# Patient Record
Sex: Female | Born: 1935 | Race: White | Hispanic: No | Smoking: Former smoker
Health system: Southern US, Community
[De-identification: ages and names within clinical notes are randomized; demographics above are authoritative.]

## PROBLEM LIST (undated history)

## (undated) DIAGNOSIS — C801 Malignant (primary) neoplasm, unspecified: Secondary | ICD-10-CM

## (undated) DIAGNOSIS — I10 Essential (primary) hypertension: Secondary | ICD-10-CM

## (undated) DIAGNOSIS — D649 Anemia, unspecified: Secondary | ICD-10-CM

## (undated) HISTORY — PX: EYE SURGERY: SHX253

## (undated) HISTORY — DX: Malignant (primary) neoplasm, unspecified: C80.1

## (undated) HISTORY — DX: Anemia, unspecified: D64.9

## (undated) HISTORY — PX: BREAST SURGERY: SHX581

---

## 2016-08-01 ENCOUNTER — Ambulatory Visit (INDEPENDENT_AMBULATORY_CARE_PROVIDER_SITE_OTHER): Payer: Medicare Other | Admitting: Family Medicine

## 2016-08-01 ENCOUNTER — Ambulatory Visit (INDEPENDENT_AMBULATORY_CARE_PROVIDER_SITE_OTHER): Payer: Medicare Other

## 2016-08-01 VITALS — BP 160/88 | HR 83 | Temp 98.2°F | Resp 17 | Ht 67.0 in | Wt 137.0 lb

## 2016-08-01 DIAGNOSIS — S0083XA Contusion of other part of head, initial encounter: Secondary | ICD-10-CM | POA: Diagnosis not present

## 2016-08-01 DIAGNOSIS — W19XXXA Unspecified fall, initial encounter: Secondary | ICD-10-CM

## 2016-08-01 DIAGNOSIS — M79641 Pain in right hand: Secondary | ICD-10-CM

## 2016-08-01 DIAGNOSIS — Y92099 Unspecified place in other non-institutional residence as the place of occurrence of the external cause: Secondary | ICD-10-CM | POA: Diagnosis not present

## 2016-08-01 DIAGNOSIS — M25531 Pain in right wrist: Secondary | ICD-10-CM

## 2016-08-01 DIAGNOSIS — Z23 Encounter for immunization: Secondary | ICD-10-CM

## 2016-08-01 DIAGNOSIS — Y92009 Unspecified place in unspecified non-institutional (private) residence as the place of occurrence of the external cause: Secondary | ICD-10-CM

## 2016-08-01 NOTE — Progress Notes (Signed)
Subjective:  By signing my name below, I, Moises Blood, attest that this documentation has been prepared under the direction and in the presence of Merri Ray, MD. Electronically Signed: Moises Blood, [place]. 08/01/2016 , 9:29 AM .  Patient was seen in Room 9 .   Patient ID: Mary Lamb, female    DOB: 17-Jan-1925, 83 y.o.   MRN: [id number] Chief Complaint  Patient presents with  . Fall    6:30 last night/ 2 tylenol twice since fall/ injured left wrist and side of face   HPI Mary Lamb is a 82 y.o. female She is a new patient to [place]. Based on her problem list, she has HTN, hypothyroidism, depression and Alzheimer's dementia. Here after a fall last night.   Patient tripped up yesterday and doesn't recall how it happened. She denies any witnesses. She's been having more frequent falls recently, as this is the 2nd one in less than 4 week. Her PCP is Dr. Marylynn Pearson and has been informed by the patient's daughter about the patient's falls. The patient will be seen by Dr. Dillard Essex later today. An ambulance was called last night but eval was normal. She did not go to the ER.   Patient usually walks about 4 miles a day. When she fell last night, she hit the left side of her face and tried to catch herself with her right wrist, as well skinned knees. She was able to get up and ambulate. She denies LOC, headache, neck pain, back pain, or weakness. She did have ice applied over the right wrist.   She lives at [facility]. She is brought in by her daughter.   There are no active problems to display for this patient.  Past Medical History:  Diagnosis Date  . Anemia   . Cancer [facility])    Past Surgical History:  Procedure Laterality Date  . BREAST SURGERY    . EYE SURGERY     No Known Allergies Prior to Admission medications   Medication Sig Start Date End Date Taking? Authorizing Provider  acetaminophen (TYLENOL) 500 MG tablet Take 500 mg by mouth every 6 (six) hours as needed.   Yes  Historical Provider, MD  buPROPion (WELLBUTRIN XL) 300 MG 24 hr tablet Take 300 mg by mouth daily.   Yes Historical Provider, MD  Cholecalciferol (VITAMIN D3) 1000 units CAPS Take by mouth daily.   Yes Historical Provider, MD  enalapril (VASOTEC) 10 MG tablet Take 10 mg by mouth daily.   Yes Historical Provider, MD  hydrochlorothiazide (MICROZIDE) 12.5 MG capsule Take 12.5 mg by mouth daily.   Yes Historical Provider, MD  levothyroxine (SYNTHROID, LEVOTHROID) 125 MCG tablet Take 125 mcg by mouth daily before breakfast.   Yes Historical Provider, MD  Melatonin 10 MG CAPS Take by mouth at bedtime.   Yes Historical Provider, MD  Memantine HCl-Donepezil HCl (NAMZARIC) 28-10 MG CP24 Take by mouth daily.   Yes Historical Provider, MD  mirtazapine (REMERON) 7.5 MG tablet Take 7.5 mg by mouth at bedtime.   Yes Historical Provider, MD   Social History   Social History  . Marital status: Widowed    Spouse name: N/A  . Number of children: N/A  . Years of education: N/A   Occupational History  . Not on file.   Social History Main Topics  . Smoking status: Former Research scientist (life sciences)  . Smokeless tobacco: Never Used  . Alcohol use No  . Drug use: No  . Sexual activity: Not on file  Other Topics Concern  . Not on file   Social History Narrative  . No narrative on file   Review of Systems  Constitutional: Negative for chills, fatigue, fever and unexpected weight change.  Respiratory: Negative for cough.   Gastrointestinal: Negative for constipation, diarrhea, nausea and vomiting.  Musculoskeletal: Positive for arthralgias, joint swelling and myalgias. Negative for back pain, gait problem and neck pain.  Skin: Positive for wound. Negative for rash.  Neurological: Negative for dizziness, weakness and headaches.       Objective:   Physical Exam  Constitutional: She is oriented to person, place, and time. She appears well-developed and well-nourished. No distress.  HENT:  Head: Normocephalic and  atraumatic.  There is some dried blood over the left upper lip with a small abrasion, [place] border appears unaffected, abrasion with some bruising over the left chin, there is some tenderness over the left maxillary sinus area, able to open and close jaw without difficulty, left maxillary with prominence and soft tissue swelling, nasal bones non tender; no apparent dental injuries, no loose teeth  Eyes: EOM are normal. Pupils are equal, round, and reactive to light.  Neck: Neck supple.  Cardiovascular: Normal rate.   Pulmonary/Chest: Effort normal. No respiratory distress.  Musculoskeletal: Normal range of motion.  4th phalanx held in flexion at the PIP with history of trigger finger, pain free rom right elbow, pain free rom right shoulder, soft tissue over the dorsal wrist, slight discomfort over distal ulnar, some vague discomfort of dorsal wrist but somewhat difficult description, good rom of her fingers and thumbs, pain free rom right thumb; Pain free rom knees bilaterally, pain free rom hips bilaterally  Neurological: She is alert and oriented to person, place, and time.  No pronator drift, equal strength upper and lower extremities bilaterally, no focal weakness, somewhat guarded right hand strength due to pain  Skin: Skin is warm and dry.  Psychiatric: She has a normal mood and affect. Her behavior is normal.  Nursing note and vitals reviewed.   Vitals:   08/01/16 0829  BP: (!) 160/88  Pulse: 83  Resp: 17  Temp: 98.2 F (36.8 C)  TempSrc: Oral  SpO2: 96%  Weight: 137 lb (62.1 kg)  Height: 5\' 7"  (1.702 m)   Dg Hand Complete Right  Result Date: 08/01/2016 CLINICAL DATA:  Dorsal wrist and hand pain.  Fall last night. EXAM: RIGHT HAND - COMPLETE 3+ VIEW COMPARISON:  None. FINDINGS: The bones appear diffusely osteopenic. There is no acute fracture or subluxation identified. Chondrocalcinosis is identified at the distal radial ulnar joint. IMPRESSION: 1. No acute bone abnormality.  2. Osteopenia Electronically Signed   By: Kerby Moors M.D.   On: 08/01/2016 09:51      Assessment & Plan:   Mary Lamb is a 82 y.o. female Right wrist pain - Plan: DG Wrist Complete Right, Splint wrist Right hand pain - Plan: DG Hand Complete Right, Splint wrist  -Possible old avulsion fracture, but no acute injury. velcro wrist splint, follow-up with PCP, consider repeat imaging or ortho eval if still having discomfort at a week and a half to 2 weeks maximum. If tolerated, range of motion once per day out of splint. RTC precautions.  Need for prophylactic vaccination and inoculation against influenza - Plan: Flu Vaccine QUAD 36+ mos IM  Fall at home, initial encounter  - 2 falls in the past 4 weeks. Denies any syncopal events, chest pain, headache, or other concerning symptoms with current fall. Discuss with  PCP today when she will be seen, RTC/ER precautions.  Contusion of face, initial encounter  - Facial abrasion upper face, soap and water cleansing. Discussed CT scanning of face or other imaging of face to rule out fracture, but was declined/deferred at present. RTC precautions, and can discuss with PCP if CT imaging needed.  Elevated BP in office, likely due to pain and possible whitecoat component. Repeat testing with PCP today, nonfocal neuro exam.  Meds ordered this encounter  Medications  . buPROPion (WELLBUTRIN XL) 300 MG 24 hr tablet    Sig: Take 300 mg by mouth daily.  . enalapril (VASOTEC) 10 MG tablet    Sig: Take 10 mg by mouth daily.  . hydrochlorothiazide (MICROZIDE) 12.5 MG capsule    Sig: Take 12.5 mg by mouth daily.  Marland Kitchen levothyroxine (SYNTHROID, LEVOTHROID) 125 MCG tablet    Sig: Take 125 mcg by mouth daily before breakfast.  . Melatonin 10 MG CAPS    Sig: Take by mouth at bedtime.  . mirtazapine (REMERON) 7.5 MG tablet    Sig: Take 7.5 mg by mouth at bedtime.  . Memantine HCl-Donepezil HCl (NAMZARIC) 28-10 MG CP24    Sig: Take by mouth daily.  .  Cholecalciferol (VITAMIN D3) 1000 units CAPS    Sig: Take by mouth daily.  Marland Kitchen acetaminophen (TYLENOL) 500 MG tablet    Sig: Take 500 mg by mouth every 6 (six) hours as needed.   Patient Instructions    Your x-ray did not show any obvious acute fracture. There was a questionable area on the outside of the wrist that may be due to an old injury. In the meantime, you can use the wrist brace for the next week or 2 weeks at the most, with gentle range of motion out of the wrist brace once per day as long as that does not hurt. Follow-up with your primary care provider to determine if other imaging or orthopedic evaluation needed. Tylenol is ok for now.   For the face contusion, you did have some soreness over the cheekbones, so if that swelling or discomfort is not improving in the next day or two, CT scan could determine if there was a fracture. Again, this can be discussed with your primary care provider.  With your recent falls, that can be related to many different causes, sometimes medications. I do not see any signs of stroke on your exam today, but discuss this further with your primary care provider.  Let me know if we can help you in the meantime.Return to the clinic or go to the nearest emergency room if any of your symptoms worsen or new symptoms occur.    IF you received an x-ray today, you will receive an invoice from [facility]. Please contact [facility] at [phone] with questions or concerns regarding your invoice.   IF you received labwork today, you will receive an invoice from [employer]. Please contact [facility] at [phone] with questions or concerns regarding your invoice.   Our billing staff will not be able to assist you with questions regarding bills from these companies.  You will be contacted with the lab results as soon as they are available. The fastest way to get your results is to activate your My Chart account.  Instructions are located on the last page of this paperwork. If you have not heard from [place] regarding the results in 2 weeks, please contact this office.       I personally performed  the services described in this documentation, which was scribed in my presence. The recorded information has been reviewed and considered, and addended by me as needed.   Signed,   Merri Ray, MD [facility].  08/01/16 10:10 AM

## 2016-08-01 NOTE — Patient Instructions (Addendum)
  Your x-ray did not show any obvious acute fracture. There was a questionable area on the outside of the wrist that may be due to an old injury. In the meantime, you can use the wrist brace for the next week or 2 weeks at the most, with gentle range of motion out of the wrist brace once per day as long as that does not hurt. Follow-up with your primary care provider to determine if other imaging or orthopedic evaluation needed. Tylenol is ok for now.   For the face contusion, you did have some soreness over the cheekbones, so if that swelling or discomfort is not improving in the next day or two, CT scan could determine if there was a fracture. Again, this can be discussed with your primary care provider.  With your recent falls, that can be related to many different causes, sometimes medications. I do not see any signs of stroke on your exam today, but discuss this further with your primary care provider.  Let me know if we can help you in the meantime.Return to the clinic or go to the nearest emergency room if any of your symptoms worsen or new symptoms occur.    IF you received an x-ray today, you will receive an invoice from [facility]. Please contact [facility] at [phone] with questions or concerns regarding your invoice.   IF you received labwork today, you will receive an invoice from [employer]. Please contact [facility] at [phone] with questions or concerns regarding your invoice.   Our billing staff will not be able to assist you with questions regarding bills from these companies.  You will be contacted with the lab results as soon as they are available. The fastest way to get your results is to activate your My Chart account. Instructions are located on the last page of this paperwork. If you have not heard from [place] regarding the results in 2 weeks, please contact this office.

## 2016-12-08 ENCOUNTER — Emergency Department (HOSPITAL_COMMUNITY)
Admission: EM | Admit: 2016-12-08 | Discharge: 2016-12-09 | Disposition: A | Discharge status: Skilled Nursing Facility | Payer: Medicare Other | Attending: Emergency Medicine | Admitting: Emergency Medicine

## 2016-12-08 ENCOUNTER — Encounter (HOSPITAL_COMMUNITY): Payer: Self-pay | Admitting: *Deleted

## 2016-12-08 ENCOUNTER — Emergency Department (HOSPITAL_COMMUNITY): Payer: Medicare Other

## 2016-12-08 DIAGNOSIS — Z79899 Other long term (current) drug therapy: Secondary | ICD-10-CM | POA: Diagnosis not present

## 2016-12-08 DIAGNOSIS — Y92129 Unspecified place in nursing home as the place of occurrence of the external cause: Secondary | ICD-10-CM | POA: Diagnosis not present

## 2016-12-08 DIAGNOSIS — I10 Essential (primary) hypertension: Secondary | ICD-10-CM | POA: Diagnosis not present

## 2016-12-08 DIAGNOSIS — F0390 Unspecified dementia without behavioral disturbance: Secondary | ICD-10-CM | POA: Diagnosis not present

## 2016-12-08 DIAGNOSIS — Y999 Unspecified external cause status: Secondary | ICD-10-CM | POA: Diagnosis not present

## 2016-12-08 DIAGNOSIS — Y939 Activity, unspecified: Secondary | ICD-10-CM | POA: Diagnosis not present

## 2016-12-08 DIAGNOSIS — S0083XA Contusion of other part of head, initial encounter: Secondary | ICD-10-CM

## 2016-12-08 DIAGNOSIS — Z853 Personal history of malignant neoplasm of breast: Secondary | ICD-10-CM | POA: Insufficient documentation

## 2016-12-08 DIAGNOSIS — Z87891 Personal history of nicotine dependence: Secondary | ICD-10-CM | POA: Diagnosis not present

## 2016-12-08 DIAGNOSIS — W19XXXA Unspecified fall, initial encounter: Secondary | ICD-10-CM

## 2016-12-08 DIAGNOSIS — S0003XA Contusion of scalp, initial encounter: Secondary | ICD-10-CM

## 2016-12-08 HISTORY — DX: Essential (primary) hypertension: I10

## 2016-12-08 LAB — CBC WITH DIFFERENTIAL/PLATELET
Basophils Absolute: 0 10*3/uL (ref 0.0–0.1)
Basophils Relative: 0 %
Eosinophils Absolute: 0 10*3/uL (ref 0.0–0.7)
Eosinophils Relative: 0 %
HEMATOCRIT: 37.9 % (ref 36.0–46.0)
HEMOGLOBIN: 12.5 g/dL (ref 12.0–15.0)
LYMPHS ABS: 1.2 10*3/uL (ref 0.7–4.0)
LYMPHS PCT: 12 %
MCH: 26.5 pg (ref 26.0–34.0)
MCHC: 33 g/dL (ref 30.0–36.0)
MCV: 80.3 fL (ref 78.0–100.0)
Monocytes Absolute: 1.9 10*3/uL — ABNORMAL HIGH (ref 0.1–1.0)
Monocytes Relative: 18 %
NEUTROS ABS: 7.2 10*3/uL (ref 1.7–7.7)
NEUTROS PCT: 70 %
Platelets: 188 10*3/uL (ref 150–400)
RBC: 4.72 MIL/uL (ref 3.87–5.11)
RDW: 14.2 % (ref 11.5–15.5)
WBC: 10.4 10*3/uL (ref 4.0–10.5)

## 2016-12-08 NOTE — ED Notes (Signed)
Patient in radiology at this time. 

## 2016-12-08 NOTE — ED Notes (Signed)
EKG given to EDP,Long,MD., for review. 

## 2016-12-08 NOTE — ED Notes (Signed)
Bed: WA01 Expected date:  Expected time:  Means of arrival:  Comments: EMS/elderly-found on floor after 24 hours-no complaints at this time

## 2016-12-08 NOTE — ED Notes (Signed)
Pt's daughter reports she left pt last evening around 1800, she states that staff reported when pt woke up around 0600 this am, pt noted to have ecchymosis around her L eye and hematoma on top of her head.  Mechanism of injury is unknown.  Daughter reports she was notified of the incident this am, and reports that pt's injuries was not "this bad."  2 area of ecchymosis noted on L side of her head along with her L eye, redness noted on her nose, R hand swelling and redness also noted.  Pt able to move bila legs without difficulty.  Daughter reports normally pt ambulates with mild assist, tonight when she visited her, pt was leaning to the right and was "off balanced."  Pt was alert upon arrival to the ED, slightly drowsy but responds to voice.  Pt has hx of dementia.

## 2016-12-08 NOTE — ED Triage Notes (Signed)
Per EMS, pt from [facility] unit, pt was found on the floor, questionable as when pt fell.  Pt's family requested pt to be transported to the ED.  EMS states that pt could have been on the floor for more than 24 hours.  No blood thinners.  Hematoma side of her head and bruising noted around her L eye.  Pt is c/o of R rib pain from last fall Valentine's Day.

## 2016-12-08 NOTE — ED Provider Notes (Signed)
[place] DEPT Provider Note   CSN: [id number] Arrival date & time: 12/08/16  2148 By signing my name below, I, Dyke Brackett, attest that this documentation has been prepared under the direction and in the presence of non-physician practitioner, Quincy Carnes, PA-C. Electronically Signed: Dyke Brackett, Scribe. 12/08/2016. 10:47 PM.   History   Chief Complaint Chief Complaint  Patient presents with  . Fall   LEVEL 5 CAVEAT DUE TO DEMENTIA   HPI Mary Lamb is a 83 y.o. female with a history of HTN, cancer and dementia who presents to the Emergency Department for evaluation s/p unwitnessed fall that took place between 6 pm last night and 6 am this morning. The fall was reported by nursing facility staff this morning at 6 AM when patient was found in her bed but with bruising noted to the left side of her face.  Daughter was not notified of this and when she went to see her this evening around 6:30pm And saw the bruising, she requested her to be transferred here for evaluation. Her daughter, she also had a fall on 11/30/2016 while in the shower. States she fell and hit her left ribs against the shower rail. She reports she had x-rays done yesterday and has brought the reports. She had bilateral rib films as well as frontal chest x-ray which was negative for any acute rib fractures. Patient is not currently on anticoagulation per daughter. She is at her baseline mental status currently.  The history is provided by a relative (daughter). No language interpreter was used.   Past Medical History:  Diagnosis Date  . Anemia   . Cancer ([place])   . Hypertension    There are no active problems to display for this patient.  Past Surgical History:  Procedure Laterality Date  . BREAST SURGERY    . EYE SURGERY      OB History    No data available      Home Medications    Prior to Admission medications   Medication Sig Start Date End Date Taking? Authorizing Provider  acetaminophen  (TYLENOL) 500 MG tablet Take 500 mg by mouth every 6 (six) hours as needed.   Yes Historical Provider, MD  buPROPion (WELLBUTRIN XL) 300 MG 24 hr tablet Take 300 mg by mouth daily.   Yes Historical Provider, MD  Cholecalciferol (VITAMIN D3) 1000 units CAPS Take 1,000 Units by mouth daily.    Yes Historical Provider, MD  enalapril (VASOTEC) 10 MG tablet Take 10 mg by mouth daily.   Yes Historical Provider, MD  ibuprofen (ADVIL,MOTRIN) 200 MG tablet Take 400 mg by mouth every 6 (six) hours as needed.   Yes Historical Provider, MD  levothyroxine (SYNTHROID, LEVOTHROID) 125 MCG tablet Take 125 mcg by mouth daily before breakfast.   Yes Historical Provider, MD  Melatonin 10 MG CAPS Take by mouth at bedtime.   Yes Historical Provider, MD  Memantine HCl-Donepezil HCl (NAMZARIC) 28-10 MG CP24 Take 1 capsule by mouth daily.    Yes Historical Provider, MD    Family History Family History  Problem Relation Age of Onset  . Cancer Mother   . Cancer Sister   . Cancer Brother   . Cancer Brother   . Stroke Sister     Social History Social History  Substance Use Topics  . Smoking status: Former Research scientist (life sciences)  . Smokeless tobacco: Never Used  . Alcohol use No    Allergies   Patient has no known allergies.   Review of Systems  Review of Systems  Unable to perform ROS: Dementia   Physical Exam Updated Vital Signs BP 174/93   Pulse 94   Temp 98.2 F (36.8 C) (Oral)   Resp 13   SpO2 94%   Physical Exam  Constitutional: She appears well-developed and well-nourished.  HENT:  Head: Normocephalic and atraumatic.  Mouth/Throat: Oropharynx is clear and moist.  Large amount of bruising of the left forehead and parietal scalp without noted skull depression or deformity  Eyes: Conjunctivae and EOM are normal. Pupils are equal, round, and reactive to light.  Bruising surrounding left eye to include the upper lid, there is no deformity of the orbital rim, pupils are symmetric and reactive bilaterally    Neck: Normal range of motion.  Cardiovascular: Normal rate, regular rhythm and normal heart sounds.   Pulmonary/Chest: Effort normal and breath sounds normal. She has no wheezes. She has no rhonchi.  No bruising or deformities of the chest wall, ribs are nontender to palpation, lungs clear  Abdominal: Soft. Bowel sounds are normal.  Musculoskeletal: Normal range of motion.  Pelvis is nontender, no leg shortening, DP pulses intact bilaterally  Neurological:  Sleeping but will awake to verbal and tactile stimuli, she answers questions and follows commands when prompted  Skin: Skin is warm and dry.  Psychiatric: She has a normal mood and affect.  Nursing note and vitals reviewed.  ED Treatments / Results  DIAGNOSTIC STUDIES:  Oxygen Saturation is 94% on RA, low by my interpretation.    COORDINATION OF CARE:  10:49 PM Discussed treatment plan with pt at bedside and pt agreed to plan.   Labs (all labs ordered are listed, but only abnormal results are displayed) Labs Reviewed  CBC WITH DIFFERENTIAL/PLATELET - Abnormal; Notable for the following:       Result Value   Monocytes Absolute 1.9 (*)    All other components within normal limits  COMPREHENSIVE METABOLIC PANEL - Abnormal; Notable for the following:    Potassium 3.3 (*)    Glucose, Bld 117 (*)    BUN 24 (*)    Total Protein 6.4 (*)    All other components within normal limits  URINALYSIS, ROUTINE W REFLEX MICROSCOPIC - Abnormal; Notable for the following:    APPearance CLOUDY (*)    Protein, ur 30 (*)    Squamous Epithelial / LPF 0-5 (*)    All other components within normal limits  URINE CULTURE  CK  I-STAT TROPOININ, ED    EKG  EKG Interpretation  Date/Time:  Thursday December 08 2016 23:10:31 EST Ventricular Rate:  105 PR Interval:    QRS Duration: 82 QT Interval:  348 QTC Calculation: 460 R Axis:   69 Text Interpretation:  Sinus tachycardia Probable left atrial enlargement Probable left ventricular  hypertrophy No STEMI.  Confirmed by LONG MD, JOSHUA [phone]) on 12/08/2016 11:12:56 PM       Radiology Dg Chest 1 View  Result Date: 12/08/2016 CLINICAL DATA:  Status post fall, with generalized chest pain and shortness of breath. Initial encounter. EXAM: CHEST 1 VIEW COMPARISON:  None. FINDINGS: The lungs are well-aerated. A small left pleural effusion is seen. Haziness at the left midlung may reflect overlying soft tissues or possibly mild infection. There is no evidence of pneumothorax. The cardiomediastinal silhouette is mildly enlarged. No acute osseous abnormalities are seen. IMPRESSION: 1. Small left pleural effusion. Mild cardiomegaly. Haziness at the left midlung may reflect overlying soft tissues or possibly mild infection. 2. No displaced rib fracture seen.  Electronically Signed   By: Garald Balding M.D.   On: 12/08/2016 23:51   Dg Pelvis 1-2 Views  Result Date: 12/08/2016 CLINICAL DATA:  Status post fall 24 hours ago, with generalized pelvic pain. Initial encounter. EXAM: PELVIS - 1-2 VIEW COMPARISON:  None. FINDINGS: There is no evidence of fracture or dislocation. Both femoral heads are seated normally within their respective acetabula. No significant degenerative change is appreciated. The sacroiliac joints are unremarkable in appearance. The visualized bowel gas pattern is grossly unremarkable in appearance. Scattered phleboliths are noted within the pelvis. IMPRESSION: No evidence of fracture or dislocation. Electronically Signed   By: Garald Balding M.D.   On: 12/08/2016 23:52   Ct Head Wo Contrast  Result Date: 12/09/2016 CLINICAL DATA:  Fall with facial bruising EXAM: CT HEAD WITHOUT CONTRAST CT MAXILLOFACIAL WITHOUT CONTRAST CT CERVICAL SPINE WITHOUT CONTRAST TECHNIQUE: Multidetector CT imaging of the head, cervical spine, and maxillofacial structures were performed using the standard protocol without intravenous contrast. Multiplanar CT image reconstructions of the cervical spine  and maxillofacial structures were also generated. COMPARISON:  None. FINDINGS: CT HEAD FINDINGS Brain: No mass lesion, intraparenchymal hemorrhage or extra-axial collection. No evidence of acute cortical infarct. There is periventricular hypoattenuation compatible with chronic microvascular disease. There is diffuse volume loss without lobar predilection. Vascular: No hyperdense vessel. CT MAXILLOFACIAL FINDINGS Osseous: --Complex facial fracture types: No LeFort, zygomaticomaxillary complex or nasoorbitoethmoidal fracture. --Simple fracture types: None. --Mandible: No fracture. There is mild anterior subluxation of both temporomandibular joints, which may be positional. Orbits: The globes appear intact. Normal appearance of the intra- and extraconal fat. Symmetric extraocular muscles. Sinuses: No fluid levels or advanced mucosal thickening. Soft tissues: There is soft tissue swelling overlying the left zygomatic arch and left frontoparietal scalp. CT CERVICAL SPINE FINDINGS Alignment: No static subluxation. Facets are aligned. Occipital condyles are normally positioned. Skull base and vertebrae: No acute fracture. Soft tissues and spinal canal: No prevertebral fluid or swelling. No visible canal hematoma. Disc levels: There is multilevel facet hypertrophy, left-greater-than-right. No bony spinal canal stenosis. Upper chest: No pneumothorax, pulmonary nodule or pleural effusion. Other: Normal visualized paraspinal cervical soft tissues. IMPRESSION: 1. Chronic microvascular ischemia and atrophy without acute intracranial abnormality. 2. No acute fracture or static subluxation of the cervical spine. 3. Left scalp and facial soft tissue swelling without associated fracture. Electronically Signed   By: Ulyses Jarred M.D.   On: 12/09/2016 00:02   Ct Angio Chest Pe W And/or Wo Contrast  Result Date: 12/09/2016 CLINICAL DATA:  Chest pain.  History of breast cancer. EXAM: CT ANGIOGRAPHY CHEST WITH CONTRAST TECHNIQUE:  Multidetector CT imaging of the chest was performed using the standard protocol during bolus administration of intravenous contrast. Multiplanar CT image reconstructions and MIPs were obtained to evaluate the vascular anatomy. CONTRAST:  100 mL Isovue 370 IV COMPARISON:  Same day chest radiograph FINDINGS: Cardiovascular: Contrast injection is sufficient to demonstrate satisfactory opacification of the pulmonary arteries to the segmental level. There is no pulmonary embolus. The main pulmonary artery is within normal limits for size. There is no CT evidence of acute right heart strain. There is atherosclerotic calcification within the thoracic aorta. No dissection, aneurysm or ulceration. There is a normal variant aortic arch branching pattern with the brachiocephalic and left common carotid arteries sharing a common origin. Heart size is normal, without pericardial effusion. Mediastinum/Nodes: No mediastinal, hilar or axillary lymphadenopathy. The visualized thyroid and thoracic esophageal course are unremarkable. Lungs/Pleura: There is biapical scarring. Bibasilar atelectasis. No pneumothorax.  There is a small left pleural effusion. No pulmonary nodules. Upper Abdomen: Contrast bolus timing is not optimized for evaluation of the abdominal organs. Within this limitation, the visualized organs of the upper abdomen are normal. Musculoskeletal: There is an age indeterminate compression deformity of the T4 vertebral body. There is a hemangioma within the T5 vertebral body. Review of the MIP images confirms the above findings. IMPRESSION: 1. No pulmonary embolus or acute aortic syndrome. 2. Age indeterminate compression fracture of the T4 vertebral body. 3. Small left pleural effusion. 4. Aortic atherosclerosis. Electronically Signed   By: Ulyses Jarred M.D.   On: 12/09/2016 02:44   Ct Cervical Spine Wo Contrast  Result Date: 12/09/2016 CLINICAL DATA:  Fall with facial bruising EXAM: CT HEAD WITHOUT CONTRAST CT  MAXILLOFACIAL WITHOUT CONTRAST CT CERVICAL SPINE WITHOUT CONTRAST TECHNIQUE: Multidetector CT imaging of the head, cervical spine, and maxillofacial structures were performed using the standard protocol without intravenous contrast. Multiplanar CT image reconstructions of the cervical spine and maxillofacial structures were also generated. COMPARISON:  None. FINDINGS: CT HEAD FINDINGS Brain: No mass lesion, intraparenchymal hemorrhage or extra-axial collection. No evidence of acute cortical infarct. There is periventricular hypoattenuation compatible with chronic microvascular disease. There is diffuse volume loss without lobar predilection. Vascular: No hyperdense vessel. CT MAXILLOFACIAL FINDINGS Osseous: --Complex facial fracture types: No LeFort, zygomaticomaxillary complex or nasoorbitoethmoidal fracture. --Simple fracture types: None. --Mandible: No fracture. There is mild anterior subluxation of both temporomandibular joints, which may be positional. Orbits: The globes appear intact. Normal appearance of the intra- and extraconal fat. Symmetric extraocular muscles. Sinuses: No fluid levels or advanced mucosal thickening. Soft tissues: There is soft tissue swelling overlying the left zygomatic arch and left frontoparietal scalp. CT CERVICAL SPINE FINDINGS Alignment: No static subluxation. Facets are aligned. Occipital condyles are normally positioned. Skull base and vertebrae: No acute fracture. Soft tissues and spinal canal: No prevertebral fluid or swelling. No visible canal hematoma. Disc levels: There is multilevel facet hypertrophy, left-greater-than-right. No bony spinal canal stenosis. Upper chest: No pneumothorax, pulmonary nodule or pleural effusion. Other: Normal visualized paraspinal cervical soft tissues. IMPRESSION: 1. Chronic microvascular ischemia and atrophy without acute intracranial abnormality. 2. No acute fracture or static subluxation of the cervical spine. 3. Left scalp and facial soft  tissue swelling without associated fracture. Electronically Signed   By: Ulyses Jarred M.D.   On: 12/09/2016 00:02   Dg Hand Complete Right  Result Date: 12/08/2016 CLINICAL DATA:  Right hand swelling with redness EXAM: RIGHT HAND - COMPLETE 3+ VIEW COMPARISON:  08/01/2016 FINDINGS: Osteopenia is present. There is no fracture. There is flexion deformity at the fourth PIP joint. No osseous erosions. No significant joint space narrowing. Calcifications at the triangular fibrocartilage. IMPRESSION: 1. Osteopenia without acute osseous abnormality 2. Chondrocalcinosis at the wrist Electronically Signed   By: Donavan Foil M.D.   On: 12/08/2016 22:42   Ct Maxillofacial Wo Contrast  Result Date: 12/09/2016 CLINICAL DATA:  Fall with facial bruising EXAM: CT HEAD WITHOUT CONTRAST CT MAXILLOFACIAL WITHOUT CONTRAST CT CERVICAL SPINE WITHOUT CONTRAST TECHNIQUE: Multidetector CT imaging of the head, cervical spine, and maxillofacial structures were performed using the standard protocol without intravenous contrast. Multiplanar CT image reconstructions of the cervical spine and maxillofacial structures were also generated. COMPARISON:  None. FINDINGS: CT HEAD FINDINGS Brain: No mass lesion, intraparenchymal hemorrhage or extra-axial collection. No evidence of acute cortical infarct. There is periventricular hypoattenuation compatible with chronic microvascular disease. There is diffuse volume loss without lobar predilection. Vascular: No hyperdense  vessel. CT MAXILLOFACIAL FINDINGS Osseous: --Complex facial fracture types: No LeFort, zygomaticomaxillary complex or nasoorbitoethmoidal fracture. --Simple fracture types: None. --Mandible: No fracture. There is mild anterior subluxation of both temporomandibular joints, which may be positional. Orbits: The globes appear intact. Normal appearance of the intra- and extraconal fat. Symmetric extraocular muscles. Sinuses: No fluid levels or advanced mucosal thickening. Soft  tissues: There is soft tissue swelling overlying the left zygomatic arch and left frontoparietal scalp. CT CERVICAL SPINE FINDINGS Alignment: No static subluxation. Facets are aligned. Occipital condyles are normally positioned. Skull base and vertebrae: No acute fracture. Soft tissues and spinal canal: No prevertebral fluid or swelling. No visible canal hematoma. Disc levels: There is multilevel facet hypertrophy, left-greater-than-right. No bony spinal canal stenosis. Upper chest: No pneumothorax, pulmonary nodule or pleural effusion. Other: Normal visualized paraspinal cervical soft tissues. IMPRESSION: 1. Chronic microvascular ischemia and atrophy without acute intracranial abnormality. 2. No acute fracture or static subluxation of the cervical spine. 3. Left scalp and facial soft tissue swelling without associated fracture. Electronically Signed   By: Ulyses Jarred M.D.   On: 12/09/2016 00:02    Procedures Procedures (including critical care time)  Medications Ordered in ED Medications - No data to display   Initial Impression / Assessment and Plan / ED Course  I have reviewed the triage vital signs and the nursing notes.  Pertinent labs & imaging results that were available during my care of the patient were reviewed by me and considered in my medical decision making (see chart for details).  83 year old female here with a fall.  This occurred sometime between 6 PM yesterday and 6 AM this morning. Daughter went to visit her this afternoon and saw a large amount of bruising on her head and wanted her transferred here for evaluation. Patient is sleeping, but awakens to verbal and tactile stimuli. She is answering questions and following commands when prompted. Daughter reports this is her baseline mental status. She does have a large amount of bruising of the left side of her head and forehead to include around the left eye. There is no bony deformities. Ribs are nontender. Pelvis is stable, no  leg shortening. Given she has had 2 falls within the past 10 days, will obtain basic labs as well as imaging studies to ensure no underlying injuries.  Imaging studies without signs of acute traumatic injuries. CT of the chest with age determinate compression fracture of T4.  Patient has not had any recent back pain. She continues moving her arms and legs normally without any apparent deficits.  CXR with questionable pneumonia.  No hypoxia here.  Given patient is in facility will cover with abx. Labwork is overall reassuring. UA without signs of infection. EKG is nonischemic. CK is normal. Given rather negative evaluation here, feel she is stable for discharge home.  Will start on levaquin (QT WNL today) given CXR findings.  Will have her follow-up with PCP.  Discussed plan with daughter, she acknowledged understanding and agreed with plan of care.  Return precautions given for any new/worsening symptoms.   Case discussed with attending physician, Dr. Regenia Skeeter, who evaluated patient and agrees with assessment and plan of care.  Final Clinical Impressions(s) / ED Diagnoses   Final diagnoses:  Fall, initial encounter  Contusion of scalp, initial encounter  Contusion of face, initial encounter    New Prescriptions Discharge Medication List as of 12/09/2016  3:39 AM    START taking these medications   Details  levofloxacin (LEVAQUIN) 750 MG  tablet Take 1 tablet (750 mg total) by mouth daily., Starting Fri 12/09/2016, Print       I personally performed the services described in this documentation, which was scribed in my presence. The recorded information has been reviewed and is accurate.   Larene Pickett, PA-C 12/09/16 [place], PA-C 12/09/16 [place], MD 12/12/16 [phone]

## 2016-12-09 ENCOUNTER — Emergency Department (HOSPITAL_COMMUNITY): Payer: Medicare Other

## 2016-12-09 ENCOUNTER — Encounter (HOSPITAL_COMMUNITY): Payer: Self-pay

## 2016-12-09 LAB — URINALYSIS, ROUTINE W REFLEX MICROSCOPIC
BILIRUBIN URINE: NEGATIVE
Bacteria, UA: NONE SEEN
GLUCOSE, UA: NEGATIVE mg/dL
HGB URINE DIPSTICK: NEGATIVE
Ketones, ur: NEGATIVE mg/dL
LEUKOCYTES UA: NEGATIVE
NITRITE: NEGATIVE
PROTEIN: 30 mg/dL — AB
Specific Gravity, Urine: 1.015 (ref 1.005–1.030)
pH: 7 (ref 5.0–8.0)

## 2016-12-09 LAB — COMPREHENSIVE METABOLIC PANEL
ALBUMIN: 3.8 g/dL (ref 3.5–5.0)
ALT: 20 U/L (ref 14–54)
AST: 29 U/L (ref 15–41)
Alkaline Phosphatase: 80 U/L (ref 38–126)
Anion gap: 8 (ref 5–15)
BUN: 24 mg/dL — AB (ref 6–20)
CHLORIDE: 103 mmol/L (ref 101–111)
CO2: 26 mmol/L (ref 22–32)
Calcium: 9.1 mg/dL (ref 8.9–10.3)
Creatinine, Ser: 0.54 mg/dL (ref 0.44–1.00)
GFR calc Af Amer: >60 mL/min (ref >60)
GFR calc non Af Amer: >60 mL/min (ref >60)
GLUCOSE: 117 mg/dL — AB (ref 65–99)
POTASSIUM: 3.3 mmol/L — AB (ref 3.5–5.1)
SODIUM: 137 mmol/L (ref 135–145)
Total Bilirubin: 1.1 mg/dL (ref 0.3–1.2)
Total Protein: 6.4 g/dL — ABNORMAL LOW (ref 6.5–8.1)

## 2016-12-09 LAB — I-STAT TROPONIN, ED: Troponin i, poc: 0 ng/mL (ref 0.00–0.08)

## 2016-12-09 LAB — CK: Total CK: 210 U/L (ref 38–234)

## 2016-12-09 MED ORDER — LEVOFLOXACIN 750 MG PO TABS: 750.0000 mg | ORAL_TABLET | Freq: Every day | ORAL | 0 refills | Status: DC

## 2016-12-09 MED ORDER — IOPAMIDOL (ISOVUE-370) INJECTION 76%
100.0000 mL | Freq: Once | INTRAVENOUS | Status: AC | PRN
  Administered 2016-12-09: 100 mL via INTRAVENOUS

## 2016-12-09 MED ORDER — IOPAMIDOL (ISOVUE-370) INJECTION 76%
INTRAVENOUS | Status: AC
  Administered 2016-12-09: 100 mL via INTRAVENOUS
  Filled 2016-12-09: qty 100

## 2016-12-09 MED ORDER — SODIUM CHLORIDE 0.9 % IJ SOLN
INTRAMUSCULAR | Status: AC
  Filled 2016-12-09: qty 50

## 2016-12-09 NOTE — ED Notes (Signed)
Patient transported to CT 

## 2016-12-09 NOTE — ED Notes (Signed)
Patient returned from CT

## 2016-12-09 NOTE — ED Notes (Signed)
[facility] on site for patient transportation to [facility].

## 2016-12-09 NOTE — Discharge Instructions (Signed)
Imaging did not show any underlying injuries.  There was some concern for pneumonia on chest x-ray. Take the prescribed medication as directed. Follow-up with your primary care doctor. Return to the ED for new or worsening symptoms.

## 2016-12-09 NOTE — ED Notes (Signed)
Pt. Walked to bathroom with 2 assist.

## 2016-12-10 LAB — URINE CULTURE

## 2017-01-28 ENCOUNTER — Ambulatory Visit (INDEPENDENT_AMBULATORY_CARE_PROVIDER_SITE_OTHER): Payer: Medicare Other | Admitting: Physician Assistant

## 2017-01-28 VITALS — BP 175/91 | HR 72 | Temp 98.1°F | Ht 67.0 in | Wt 131.2 lb

## 2017-01-28 DIAGNOSIS — R4182 Altered mental status, unspecified: Secondary | ICD-10-CM

## 2017-01-28 LAB — POCT URINALYSIS DIP (MANUAL ENTRY)
BILIRUBIN UA: NEGATIVE
Blood, UA: NEGATIVE
Glucose, UA: NEGATIVE mg/dL
Ketones, POC UA: NEGATIVE mg/dL
LEUKOCYTES UA: NEGATIVE
NITRITE UA: NEGATIVE
Spec Grav, UA: 1.02 (ref 1.010–1.025)
Urobilinogen, UA: 4 E.U./dL — AB
pH, UA: 7 (ref 5.0–8.0)

## 2017-01-28 LAB — POC MICROSCOPIC URINALYSIS (UMFC): MUCUS RE: ABSENT

## 2017-01-28 NOTE — Progress Notes (Signed)
Mary Lamb  MRN: [id number] DOB: 03/21/1925  PCP: Marylynn Pearson, MD  Chief Complaint  Patient presents with  . Altered Mental Status  . Urinary Accidents    Subjective:  Pt presents to clinic for altered mental status for the last 2 weeks and then 2 urinary accidents over the last 2 days - history is presented by her daughter who brought her from the assisted living residence.  She has dementia but it seems to be progressing recently - esp over the last 2 weeks - she is also not eating as much as she used to.  Metoprolol was added to her medications about 2 weeks ago and daughter wonders whether that could be a possible cause.  She has not run fevers.  She has been more lethargic over the last 2 weeks and not as active around the yard like she once was.  She has no agitation or aggression.  Daughter mainly wants to r/o UTI as she has had these in the past.  She has had no cold symptoms recently.  No F/C/cough.  She is working with her PCP with her BP and it is being checked daily at her facility.  The addition of metoprolol was for her BP.  Review of Systems  Unable to perform ROS: Dementia    There are no active problems to display for this patient.   Current Outpatient Prescriptions on File Prior to Visit  Medication Sig Dispense Refill  . acetaminophen (TYLENOL) 500 MG tablet Take 500 mg by mouth every 6 (six) hours as needed.    Marland Kitchen buPROPion (WELLBUTRIN XL) 300 MG 24 hr tablet Take 300 mg by mouth daily.    . Cholecalciferol (VITAMIN D3) 1000 units CAPS Take 1,000 Units by mouth daily.     . enalapril (VASOTEC) 10 MG tablet Take 10 mg by mouth daily.    Marland Kitchen ibuprofen (ADVIL,MOTRIN) 200 MG tablet Take 400 mg by mouth every 6 (six) hours as needed.    Marland Kitchen levothyroxine (SYNTHROID, LEVOTHROID) 125 MCG tablet Take 125 mcg by mouth daily before breakfast.    . Melatonin 10 MG CAPS Take by mouth at bedtime.    . Memantine HCl-Donepezil HCl (NAMZARIC) 28-10 MG CP24 Take 1 capsule by mouth  daily.      No current facility-administered medications on file prior to visit.     No Known Allergies  Pt patients past, family and social history were reviewed and updated.   Objective:  BP (!) 175/91   Pulse 72   Temp 98.1 F (36.7 C) (Oral)   Ht 5\' 7"  (1.702 m)   Wt 131 lb 3 oz (59.5 kg)   BMI 20.55 kg/m   Physical Exam  Constitutional: She is oriented to person, place, and time and well-developed, well-nourished, and in no distress.  HENT:  Head: Normocephalic and atraumatic.  Right Ear: Hearing and external ear normal.  Left Ear: Hearing and external ear normal.  Eyes: Conjunctivae are normal.  Neck: Normal range of motion.  Cardiovascular: Normal rate, regular rhythm and normal heart sounds.   No murmur heard. Pulmonary/Chest: Effort normal and breath sounds normal. She has no wheezes.  Abdominal: Soft. Bowel sounds are normal. There is no tenderness.  Neurological: She is alert and oriented to person, place, and time. Gait normal.  Skin: Skin is warm and dry.  Psychiatric: Mood, memory, affect and judgment normal.  Vitals reviewed.  Results for orders placed or performed in visit on 01/28/17  POCT urinalysis dipstick  Result Value Ref Range   Color, UA yellow yellow   Clarity, UA clear clear   Glucose, UA negative negative mg/dL   Bilirubin, UA negative negative   Ketones, POC UA negative negative mg/dL   Spec Grav, UA 1.020 1.010 - 1.025   Blood, UA negative negative   pH, UA 7.0 5.0 - 8.0   Protein Ur, POC trace (A) negative mg/dL   Urobilinogen, UA 4.0 (A) 0.2 or 1.0 E.U./dL   Nitrite, UA Negative Negative   Leukocytes, UA Negative Negative  POCT Microscopic Urinalysis ([facility])  Result Value Ref Range   WBC,UR,HPF,POC None None WBC/hpf   RBC,UR,HPF,POC None None RBC/hpf   Bacteria None None, Too numerous to count   Mucus Absent Absent   Epithelial Cells, UR Per Microscopy Few (A) None, Too numerous to count cells/hpf     Assessment and Plan :    Altered mental status, unspecified altered mental status type - Plan: POCT urinalysis dipstick, POCT Microscopic Urinalysis ([facility]), Urine culture No sign today of UTI - will send for culture.  Continue to monitor BP - likely new BP medication can not completely started to work yet.  F/u will be with PCP.  Dr Dillard Essex - fax [phone]  Windell Hummingbird PA-C  [facility] 01/28/2017 3:03 PM

## 2017-01-28 NOTE — Patient Instructions (Signed)
     IF you received an x-ray today, you will receive an invoice from [facility]. Please contact [facility] at [phone] with questions or concerns regarding your invoice.   IF you received labwork today, you will receive an invoice from [facility]. Please contact [facility] at [phone] with questions or concerns regarding your invoice.   Our billing staff will not be able to assist you with questions regarding bills from these companies.  You will be contacted with the lab results as soon as they are available. The fastest way to get your results is to activate your My Chart account. Instructions are located on the last page of this paperwork. If you have not heard from us regarding the results in 2 weeks, please contact this office.     

## 2017-01-29 LAB — URINE CULTURE: Organism ID, Bacteria: NO GROWTH

## 2017-02-07 ENCOUNTER — Encounter: Payer: Self-pay | Admitting: Radiology

## 2017-02-10 ENCOUNTER — Encounter: Payer: Self-pay | Admitting: Cardiovascular Disease

## 2017-02-10 ENCOUNTER — Ambulatory Visit (INDEPENDENT_AMBULATORY_CARE_PROVIDER_SITE_OTHER): Payer: Medicare Other | Admitting: Cardiovascular Disease

## 2017-02-10 VITALS — BP 108/64 | HR 103 | Ht 66.0 in | Wt 125.6 lb

## 2017-02-10 DIAGNOSIS — Z853 Personal history of malignant neoplasm of breast: Secondary | ICD-10-CM

## 2017-02-10 DIAGNOSIS — R634 Abnormal weight loss: Secondary | ICD-10-CM | POA: Diagnosis not present

## 2017-02-10 DIAGNOSIS — I10 Essential (primary) hypertension: Secondary | ICD-10-CM

## 2017-02-10 DIAGNOSIS — R0602 Shortness of breath: Secondary | ICD-10-CM

## 2017-02-10 DIAGNOSIS — E039 Hypothyroidism, unspecified: Secondary | ICD-10-CM

## 2017-02-10 DIAGNOSIS — I951 Orthostatic hypotension: Secondary | ICD-10-CM

## 2017-02-10 DIAGNOSIS — F0390 Unspecified dementia without behavioral disturbance: Secondary | ICD-10-CM

## 2017-02-10 DIAGNOSIS — G4710 Hypersomnia, unspecified: Secondary | ICD-10-CM

## 2017-02-10 DIAGNOSIS — Z79899 Other long term (current) drug therapy: Secondary | ICD-10-CM

## 2017-02-10 LAB — CBC WITH DIFFERENTIAL/PLATELET
Basophils Absolute: 0 {cells}/uL (ref 0–200)
Basophils Relative: 0 %
Eosinophils Absolute: 0 {cells}/uL — ABNORMAL LOW (ref 15–500)
Eosinophils Relative: 0 %
HCT: 45.8 % — ABNORMAL HIGH (ref 35.0–45.0)
Hemoglobin: 14.5 g/dL (ref 11.7–15.5)
Lymphocytes Relative: 9 %
Lymphs Abs: 1044 {cells}/uL (ref 850–3900)
MCH: 26.2 pg — ABNORMAL LOW (ref 27.0–33.0)
MCHC: 31.7 g/dL — ABNORMAL LOW (ref 32.0–36.0)
MCV: 82.8 fL (ref 80.0–100.0)
MPV: 9.7 fL (ref 7.5–12.5)
Monocytes Absolute: 1624 {cells}/uL — ABNORMAL HIGH (ref 200–950)
Monocytes Relative: 14 %
Neutro Abs: 8932 {cells}/uL — ABNORMAL HIGH (ref 1500–7800)
Neutrophils Relative %: 77 %
Platelets: 305 K/uL (ref 140–400)
RBC: 5.53 MIL/uL — ABNORMAL HIGH (ref 3.80–5.10)
RDW: 14.2 % (ref 11.0–15.0)
WBC: 11.6 K/uL — ABNORMAL HIGH (ref 3.8–10.8)

## 2017-02-10 LAB — COMPREHENSIVE METABOLIC PANEL
ALBUMIN: 3.5 g/dL — AB (ref 3.6–5.1)
ALT: 18 U/L (ref 6–29)
AST: 24 U/L (ref 10–35)
Alkaline Phosphatase: 105 U/L (ref 33–130)
BUN: 25 mg/dL (ref 7–25)
CALCIUM: 9.4 mg/dL (ref 8.6–10.4)
CHLORIDE: 103 mmol/L (ref 98–110)
CO2: 24 mmol/L (ref 20–31)
Creat: 0.95 mg/dL — ABNORMAL HIGH (ref 0.60–0.88)
Glucose, Bld: 95 mg/dL (ref 65–99)
POTASSIUM: 3.9 mmol/L (ref 3.5–5.3)
Sodium: 142 mmol/L (ref 135–146)
Total Bilirubin: 1 mg/dL (ref 0.2–1.2)
Total Protein: 6.3 g/dL (ref 6.1–8.1)

## 2017-02-10 NOTE — Patient Instructions (Signed)
Medication Instructions:   1.) STOP ENALAPRIL AND METOPROLOL  Labwork:  CMET, CBC-DIFF TSH T3 T4 TODAY  Testing/Procedures:  Your physician has requested that you have an echocardiogram. Echocardiography is a painless test that uses sound waves to create images of your heart. It provides your doctor with information about the size and shape of your heart and how well your heart's chambers and valves are working. This procedure takes approximately one hour. There are no restrictions for this procedure.  This will be done at our  [place] unless otherwise requested.  Follow-Up:  3-4 weeks with Dr Claiborne Billings  Any Other Special Instructions Will Be Listed Below (If Applicable).

## 2017-02-10 NOTE — Progress Notes (Signed)
Cardiology Office Note    Date:  02/11/2017   ID:  Mary Lamb, DOB 03/01/1925, MRN [id number]  PCP:  Mary Pearson, MD  Cardiologist:  Mary Majestic, MD   Chief Complaint  Patient presents with  . Shortness of Breath    pt states some SOB  . Edema    states some in both ankles    New Cardiology Evaluation  History of Present Illness:  Mary Lamb is a 83 y.o. female  who is brought here by her daughter and is a resident of [facility] memory unit.  She is followed by Dr. Marylynn Lamb.  Mary Lamb has a history of hypertension, hypothyroidism, and has developed progressive dementia and recent weight loss.  According to her daughter, she has a history of hypertension for at least 10 years.  She has had significant decline in memory issues over the past 4 years esulting in her  move from the [place] and is been at [facility] for several years.  She has been on enalapril for hypertension for many years and has also been taking levothyroxine 125 g.  Over the last several months, her blood pressure had been increased and as result, she was started initially on metoprolol succinate 25 mg and ultimately this had been titrated up to 25 mg twice a day.  She has had significant recent weight loss, as well as poor appetite and has lost 16 pounds over the past 1-1/2 months.  Recently, her metoprolol has been reduced and tapered and since she has not been eating.  She had been changed to tartrate.  The patient cannot give a history.  During the office evaluation, the patient's daughter called Dr. Marylynn Lamb, so I was able to speak with her during this encounter and obtain more clinical history.  In January 2018, the patient's BUN was 26 and creatinine 0.86.  Her blood pressure had risen this year up to 174 systolic.  She was recently evaluated for possible UTI and had negative urinalysis and culture.  She was evaluated at urgent care and [facility] on 01/28/2017 and during that evaluation, she was  hypertensive  with a blood pressure of 175/91.  The patient has never had a cardiac evaluation.  She also has noticed some chest wall discomfort.  She has a history of breast surgery and is status post mastectomy.  Recently, she has been sleeping often and has been very fatigued.  An Epworth Sleepiness Scale score was calculated in the office today and this endorsed at 24, which is maximal excessive daytime sleepiness.  She presents for evaluation.   Past Medical History:  Diagnosis Date  . Anemia   . Cancer ([place])   . Hypertension     Past Surgical History:  Procedure Laterality Date  . BREAST SURGERY    . EYE SURGERY      Current Medications: Outpatient Medications Prior to Visit  Medication Sig Dispense Refill  . acetaminophen (TYLENOL) 500 MG tablet Take 500 mg by mouth every 6 (six) hours as needed.    Marland Kitchen buPROPion (WELLBUTRIN XL) 300 MG 24 hr tablet Take 300 mg by mouth daily.    . Cholecalciferol (VITAMIN D3) 1000 units CAPS Take 1,000 Units by mouth daily.     Marland Kitchen ibuprofen (ADVIL,MOTRIN) 200 MG tablet Take 400 mg by mouth every 6 (six) hours as needed.    Marland Kitchen levothyroxine (SYNTHROID, LEVOTHROID) 125 MCG tablet Take 125 mcg by mouth daily before breakfast.    . Melatonin 10 MG  CAPS Take by mouth at bedtime.    . Memantine HCl-Donepezil HCl (NAMZARIC) 28-10 MG CP24 Take 1 capsule by mouth daily.     . enalapril (VASOTEC) 10 MG tablet Take 10 mg by mouth daily.    . metoprolol tartrate (LOPRESSOR) 25 MG tablet Take 25 mg by mouth 2 (two) times daily.     No facility-administered medications prior to visit.      Allergies:   Patient has no known allergies.   Social History   Social History  . Marital status: Widowed    Spouse name: N/A  . Number of children: N/A  . Years of education: N/A   Social History Main Topics  . Smoking status: Former Research scientist (life sciences)  . Smokeless tobacco: Never Used  . Alcohol use No  . Drug use: No  . Sexual activity: Not Asked   Other Topics Concern    . None   Social History Narrative  . None    Additional social history is notable that she has 2 children and 9 grandchildren.  There is no alcohol use.  Family History:  The patient's  family history includes Cancer in her brother, brother, mother, and sister; Stroke in her sister.  Her mother died at age 68 with colon cancer.  Her father died at 7 with a heart attack.  A brother died at 76 with heart problems.  2 other brothers died secondary to cancer.  A sister died at age 32 with breast cancer him another sister with stroke, and another sister at age 84 with stomach cancer.  The patient has 2 children, ages 83 and 54.  ROS General: Negative; No fevers, chills, or night sweats; positive for weight loss  HEENT: Negative; No changes in vision or hearing, sinus congestion, difficulty swallowing Pulmonary: Negative; No cough, wheezing, shortness of breath, hemoptysis Cardiovascular:  See HPI GI: Negative; No nausea, vomiting, diarrhea, or abdominal pain GU: Negative; No dysuria, hematuria, or difficulty voiding Musculoskeletal: Negative; no myalgias, joint pain, or weakness Hematologic/Oncology: Negative; no easy bruising, bleeding Endocrine: Positive for hypothyroidism on levothyroxine replacement Neuro: Positive for Alzheimer's disease Skin: Negative; No rashes or skin lesions Psychiatric: Negative; No behavioral problems, depression Sleep: Positive for significant daytime sleepiness Other comprehensive 14 point system review is negative.   PHYSICAL EXAM:   VS:  BP 108/64   Pulse (!) 103   Ht '5\' 6"'  (1.676 m)   Wt 125 lb 9.6 oz (57 kg)   BMI 20.27 kg/m     Repeat blood pressure by me was 96/60 supine and 84/60 in the sitting position  Wt Readings from Last 3 Encounters:  02/10/17 125 lb 9.6 oz (57 kg)  01/28/17 131 lb 3 oz (59.5 kg)  08/01/16 137 lb (62.1 kg)    General: Very somnolent  Skin: normal turgor, no rashes, warm and dry HEENT: Normocephalic, atraumatic.  Pupils equal round and reactive to light; sclera anicteric; extraocular muscles intact; Fundi No hemorrhages or exudates. History of macular degeneration Nose without nasal septal hypertrophy Mouth/Parynx benign; Mallinpatti scale 3 Neck: No JVD, no carotid bruits; normal carotid upstroke Lungs: clear to ausculatation and percussion; no wheezing or rales Chest wall: Status post right mastectomy; no definite chest wall tenderness to palpation. Heart: PMI not displaced, RRR, s1 s2 normal, 1/6 systolic murmur, no diastolic murmur, no rubs, gallops, thrills, or heaves Abdomen: soft, nontender; no hepatosplenomehaly, BS+; abdominal aorta nontender and not dilated by palpation. Back: no CVA tenderness Pulses 2+ Musculoskeletal: No obvious deformities Extremities: no  clubbing cyanosis or edema, Homan's sign negative  Neurologic: grossly nonfocal; Cranial nerves grossly wnl Psychologic: Flat affect   Studies/Labs Reviewed:   EKG:  EKG is ordered today.  ECG (independently read by me): Sinus tachycardia 103 bpm.  Possible left atrial enlargement.  Nonspecific ST changes.  QTc interval 442 ms.  Recent Labs: BMP Latest Ref Rng & Units 02/10/2017 12/08/2016  Glucose 65 - 99 mg/dL 95 117(H)  BUN 7 - 25 mg/dL 25 24(H)  Creatinine 0.60 - 0.88 mg/dL 0.95(H) 0.54  Sodium 135 - 146 mmol/L 142 137  Potassium 3.5 - 5.3 mmol/L 3.9 3.3(L)  Chloride 98 - 110 mmol/L 103 103  CO2 20 - 31 mmol/L 24 26  Calcium 8.6 - 10.4 mg/dL 9.4 9.1     Hepatic Function Latest Ref Rng & Units 02/10/2017 12/08/2016  Total Protein 6.1 - 8.1 g/dL 6.3 6.4(L)  Albumin 3.6 - 5.1 g/dL 3.5(L) 3.8  AST 10 - 35 U/L 24 29  ALT 6 - 29 U/L 18 20  Alk Phosphatase 33 - 130 U/L 105 80  Total Bilirubin 0.2 - 1.2 mg/dL 1.0 1.1    CBC Latest Ref Rng & Units 02/10/2017 12/08/2016  WBC 3.8 - 10.8 K/uL 11.6(H) 10.4  Hemoglobin 11.7 - 15.5 g/dL 14.5 12.5  Hematocrit 35.0 - 45.0 % 45.8(H) 37.9  Platelets 140 - 400 K/uL 305 188   Lab  Results  Component Value Date   MCV 82.8 02/10/2017   MCV 80.3 12/08/2016   Lab Results  Component Value Date   TSH 15.22 (H) 02/10/2017   No results found for: HGBA1C   BNP No results found for: BNP  ProBNP No results found for: PROBNP   Lipid Panel  No results found for: CHOL, TRIG, HDL, CHOLHDL, VLDL, LDLCALC, LDLDIRECT   RADIOLOGY: No results found.   Additional studies/ records that were reviewed today include:  I reviewed the her to screen [facility] list.  I have contacted Dr. Marylynn Lamb during the evaluation to discuss her recent history and laboratory.  I reviewed her recent evaluation at urgent care at [place]:    1. Essential hypertension   2. Hypothyroidism, unspecified type   3. SOB (shortness of breath)   4. Medication management   5. Weight loss, abnormal   6. Rapidly progressive dementia   7. Orthostatic hypotension   8. Excessive sleepiness   9. History of breast cancer      PLAN:  Ms. Georgianna Band is a 83 year old female who has a history of hypertension, macular degeneration, hypothyroidism and progressive memory decline.  Over the last several months, she has had reduced intake and according to her daughter has lost 16 pounds of weight.  She has not been eating well.  There also has been more somnolence.  She has had some blood pressure lability, necessitating initiation of beta blocker therapy initially with metoprolol, succinate 25 mg twice a day, but ultimately this has been reduced.  On exam today, she is hypotensive and orthostatic.  Remotely had normal renal function and her BUN in January 2018 was 26 with creatinine of 0.86.  I am concerned with her lack of eating, she is becoming dehydrated and may also potentially develop renal insufficiency on her current therapy.  Since her blood pressure is low today, I have discussed with Dr. Dillard Essex that enalapril should be held and possibly discontinued.  She is tachycardic today on exam and has  been on beta blocker therapy which has been  reduced.  However, her increased heart rate may be an attempt to augment her blood pressure and improve cardiac output and her beta blocker which had been slowly reduced and weaned will be held.  She has never had an echo Doppler study and I will schedule her to have this done as soon as possible to evaluate both systolic and diastolic function, valvular architecture, and pericardium.  Laboratory will need to be checked to make certain she is not hyperthyroid on her current levothyroxine replacement.  We will check laboratory today.  She will return to [facility] and Dr. Dillard Essex will monitor her orthostatic blood pressures closely.  I will see her in 3-4 weeks for reevaluation.    Medication Adjustments/Labs and Tests Ordered: Current medicines are reviewed at length with the patient today.  Concerns regarding medicines are outlined above.  Medication changes, Labs and Tests ordered today are listed in the Patient Instructions below. Patient Instructions  Medication Instructions:   1.) STOP ENALAPRIL AND METOPROLOL  Labwork:  CMET, CBC-DIFF TSH T3 T4 TODAY  Testing/Procedures:  Your physician has requested that you have an echocardiogram. Echocardiography is a painless test that uses sound waves to create images of your heart. It provides your doctor with information about the size and shape of your heart and how well your heart's chambers and valves are working. This procedure takes approximately one hour. There are no restrictions for this procedure.  This will be done at our  [place] unless otherwise requested.  Follow-Up:  3-4 weeks with Dr Claiborne Billings  Any Other Special Instructions Will Be Listed Below (If Applicable).       Dr Dillard Essex - fax [phone]  Signed, Mary Majestic, MD , [facility] 02/11/2017 11:34 AM    [facility] [address] [place], [place], [place]  [address] Phone: [phone]

## 2017-02-11 LAB — T3, FREE: T3 FREE: 2.4 pg/mL (ref 2.3–4.2)

## 2017-02-11 LAB — TSH: TSH: 15.22 mIU/L — ABNORMAL HIGH

## 2017-02-11 LAB — T4, FREE: FREE T4: 1.4 ng/dL (ref 0.8–1.8)

## 2017-02-13 ENCOUNTER — Telehealth: Payer: Self-pay | Admitting: *Deleted

## 2017-02-13 NOTE — Telephone Encounter (Signed)
Faxed Dr Evette Georges 02/11/17 office note to patient's PCP.

## 2017-02-15 ENCOUNTER — Telehealth: Payer: Self-pay | Admitting: Cardiovascular Disease

## 2017-02-15 NOTE — Telephone Encounter (Signed)
New Message   pt POA verbalized that she is calling for rn   To verbalized that pt has pneumonia

## 2017-02-15 NOTE — Telephone Encounter (Signed)
Spoke with pt dtr, the patient has been started on antibiotics. She wanted to let dr Claiborne Billings know because the patient was having chest pain and they were not sure why. Will make dr Claiborne Billings aware.

## 2017-02-23 ENCOUNTER — Other Ambulatory Visit: Payer: Self-pay

## 2017-02-23 ENCOUNTER — Ambulatory Visit (HOSPITAL_COMMUNITY): Payer: Medicare Other | Attending: Cardiology

## 2017-02-23 DIAGNOSIS — R0602 Shortness of breath: Secondary | ICD-10-CM | POA: Insufficient documentation

## 2017-02-23 DIAGNOSIS — I951 Orthostatic hypotension: Secondary | ICD-10-CM | POA: Diagnosis not present

## 2017-02-23 DIAGNOSIS — I348 Other nonrheumatic mitral valve disorders: Secondary | ICD-10-CM | POA: Diagnosis not present

## 2017-02-23 DIAGNOSIS — I10 Essential (primary) hypertension: Secondary | ICD-10-CM

## 2017-02-23 DIAGNOSIS — R634 Abnormal weight loss: Secondary | ICD-10-CM

## 2017-02-23 DIAGNOSIS — I119 Hypertensive heart disease without heart failure: Secondary | ICD-10-CM | POA: Diagnosis not present

## 2017-02-27 NOTE — Telephone Encounter (Signed)
thx for making me aware.

## 2017-03-04 ENCOUNTER — Ambulatory Visit (INDEPENDENT_AMBULATORY_CARE_PROVIDER_SITE_OTHER): Payer: Medicare Other | Admitting: Family Medicine

## 2017-03-04 ENCOUNTER — Encounter: Payer: Self-pay | Admitting: Family Medicine

## 2017-03-04 ENCOUNTER — Ambulatory Visit (INDEPENDENT_AMBULATORY_CARE_PROVIDER_SITE_OTHER): Payer: Medicare Other

## 2017-03-04 ENCOUNTER — Telehealth: Payer: Self-pay | Admitting: Family Medicine

## 2017-03-04 VITALS — BP 134/87 | HR 110 | Temp 98.0°F | Resp 16 | Ht 67.0 in | Wt 121.4 lb

## 2017-03-04 DIAGNOSIS — R000 Tachycardia, unspecified: Secondary | ICD-10-CM | POA: Diagnosis not present

## 2017-03-04 DIAGNOSIS — R4182 Altered mental status, unspecified: Secondary | ICD-10-CM | POA: Diagnosis not present

## 2017-03-04 DIAGNOSIS — R05 Cough: Secondary | ICD-10-CM | POA: Diagnosis not present

## 2017-03-04 DIAGNOSIS — R059 Cough, unspecified: Secondary | ICD-10-CM

## 2017-03-04 DIAGNOSIS — R7989 Other specified abnormal findings of blood chemistry: Secondary | ICD-10-CM

## 2017-03-04 DIAGNOSIS — F0280 Dementia in other diseases classified elsewhere without behavioral disturbance: Secondary | ICD-10-CM | POA: Diagnosis not present

## 2017-03-04 DIAGNOSIS — R638 Other symptoms and signs concerning food and fluid intake: Secondary | ICD-10-CM | POA: Diagnosis not present

## 2017-03-04 DIAGNOSIS — G309 Alzheimer's disease, unspecified: Secondary | ICD-10-CM | POA: Diagnosis not present

## 2017-03-04 DIAGNOSIS — Z8701 Personal history of pneumonia (recurrent): Secondary | ICD-10-CM

## 2017-03-04 DIAGNOSIS — R946 Abnormal results of thyroid function studies: Secondary | ICD-10-CM

## 2017-03-04 DIAGNOSIS — R8271 Bacteriuria: Secondary | ICD-10-CM

## 2017-03-04 LAB — POCT URINALYSIS DIP (MANUAL ENTRY)
BILIRUBIN UA: NEGATIVE mg/dL
Blood, UA: NEGATIVE
Glucose, UA: NEGATIVE mg/dL
Nitrite, UA: NEGATIVE
PH UA: 6 (ref 5.0–8.0)
Protein Ur, POC: 100 mg/dL — AB
SPEC GRAV UA: 1.02 (ref 1.010–1.025)
Urobilinogen, UA: 4 E.U./dL — AB

## 2017-03-04 LAB — POCT CBC
GRANULOCYTE PERCENT: 62 % (ref 37–80)
HEMATOCRIT: 39.9 % (ref 37.7–47.9)
HEMOGLOBIN: 13.5 g/dL (ref 12.2–16.2)
Lymph, poc: 2.3 (ref 0.6–3.4)
MCH: 26.8 pg — AB (ref 27–31.2)
MCHC: 33.8 g/dL (ref 31.8–35.4)
MCV: 79.2 fL — AB (ref 80–97)
MID (cbc): 1.6 — AB (ref 0–0.9)
MPV: 7.5 fL (ref 0–99.8)
POC Granulocyte: 6.3 (ref 2–6.9)
POC LYMPH PERCENT: 22.3 %L (ref 10–50)
POC MID %: 15.7 % — AB (ref 0–12)
Platelet Count, POC: 274 10*3/uL (ref 142–424)
RBC: 5.04 M/uL (ref 4.04–5.48)
RDW, POC: 14.2 %
WBC: 10.1 10*3/uL (ref 4.6–10.2)

## 2017-03-04 LAB — POC MICROSCOPIC URINALYSIS (UMFC)

## 2017-03-04 LAB — GLUCOSE, POCT (MANUAL RESULT ENTRY): POC GLUCOSE: 93 mg/dL (ref 70–99)

## 2017-03-04 NOTE — Telephone Encounter (Signed)
The patient's daughter called back with the fax #'s for the results to go to. The fax number for Dr. Anthoney Harada is [phone] and [place] [phone].  Mary Lamb's number is [phone]

## 2017-03-04 NOTE — Patient Instructions (Addendum)
I will check kidney function, thyroid, other electrolytes. It appears that the cardiologist recommended stopping enalapril for now, especially if she is not drinking enough fluids.   Some symptoms currently appear to be due to volume depletion/dehydration. Small amount of IV fluid would be an option, but if you want can start with increasing oral fluids.   I will check urine culture to look for infection.   I would recommend Hospice discussion in case progression is due to dementia. This often come as a referral form primary care provider, but call them to see if a referral is needed.   [facility]  [address], [place], [place] [address]  [phone]  Follow up with Dr. Dillard Essex in next 2 weeks or sooner if possible.   Return to the clinic or go to the nearest emergency room if any of your symptoms worsen or new symptoms occur.    IF you received an x-ray today, you will receive an invoice from [facility]. Please contact [facility] at [phone] with questions or concerns regarding your invoice.   IF you received labwork today, you will receive an invoice from [facility]. Please contact [facility] at [phone] with questions or concerns regarding your invoice.   Our billing staff will not be able to assist you with questions regarding bills from these companies.  You will be contacted with the lab results as soon as they are available. The fastest way to get your results is to activate your My Chart account. Instructions are located on the last page of this paperwork. If you have not heard from [place] regarding the results in 2 weeks, please contact this office.

## 2017-03-04 NOTE — Progress Notes (Signed)
Subjective:  By signing my name below, I, Moises Blood, attest that this documentation has been prepared under the direction and in the presence of Merri Ray, MD. Electronically Signed: Moises Blood, [place]. 03/04/2017 , 1:15 PM .  Patient was seen in Room 2 .   Patient ID: Mary Lamb, female    DOB: 1925/01/21, 83 y.o.   MRN: [id number] Chief Complaint  Patient presents with  . Urinary Tract Infection    abn gait, low appetitie, low affect, very dry, has to be fed and made to drink at nursing hime   HPI Mary Lamb is a 83 y.o. female  History of multiple medical problems including HTN, hypothyroidism, and dementia. Here for possible UTI.   She was last seen here by Windell Hummingbird, PA-C on April 14th for altered mental status, noted for 2 weeks prior with episodes of urinary incontinence. She has history of dementia, progressive at that time 2 weeks with decreased PO intake. She had been started on metoprolol 2 weeks prior and some increased lethargy at that visit. Her urinalysis was normal, and urine culture did not indicate infection. She has seen cardiology (Dr. Claiborne Billings) on April 27th. She was hypotensive and orthostatic at that visit because of low BP and enalapril was recommended to be held. Her beta blocker was continued to be held. Recommended close follow up with her PCP, Dr. Dillard Essex. On review of labs from April 27th, creatinine was 0.95 up from 0.54, slightly low albumin at 3.5, hemoglobin normal at 14.5, slight leukocytosis, WBC at 11.6, and TSH elevated at 15.22, but normal free T4 and free T3. She is still taking enalapril.   Patient is brought in by a family member (her daughter), and HPI given by family member. Patient has Alzheimer's dementia. The patient has worsened in the past week with more sleeping lately. She has abnormal gait and requires someone holding her hand. She's also stopped talking, and stopped eating, lost almost 20 lbs. She started PT OT. Patient used to respond  to humor but not anymore. She also stopped taking medications for a while, and had to take her medications crushed with ice cream. She was diagnosed with pneumonia on April 29th, and received antibiotics 175mg  and a probiotic, taken for 5 days. She was rechecked because heard wet cough, and had 2nd chest xray on May 13th, which did not show pneumonia. Her family still hears fluid in the patient's cough. She had echocardiogram done with cardiologist, and will return on May 30th for results. She denies fever. She isn't taking metoprolol right now. Her sister has history of stroke. Her daughter ordered Ensure for the patient, and was able to drink a whole one this morning.   She doesn't have a neurologist. Her PCP, Dr. Dillard Essex, has been out of the country. She has seen Dr. Dillard Essex in the past 6 weeks, who treated the pneumonia.   We discussed IV fluids, but daughter deferred at this time. She has been drinking Ensure and will try other fluids first. Over 40 minutes of face-to-face care, over half was counseling.   There are no active problems to display for this patient.  Past Medical History:  Diagnosis Date  . Anemia   . Cancer ([place])   . Hypertension    Past Surgical History:  Procedure Laterality Date  . BREAST SURGERY    . EYE SURGERY     No Known Allergies Prior to Admission medications   Medication Sig Start Date End Date Taking? Authorizing Provider  acetaminophen (TYLENOL) 500 MG tablet Take 500 mg by mouth every 6 (six) hours as needed.    [provider]  buPROPion (WELLBUTRIN XL) 300 MG 24 hr tablet Take 300 mg by mouth daily.    [provider]  Cholecalciferol (VITAMIN D3) 1000 units CAPS Take 1,000 Units by mouth daily.     [provider]  ibuprofen (ADVIL,MOTRIN) 200 MG tablet Take 400 mg by mouth every 6 (six) hours as needed.    [provider]  levothyroxine (SYNTHROID, LEVOTHROID) 125 MCG tablet Take 125 mcg by mouth daily before breakfast.     [provider]  Melatonin 10 MG CAPS Take by mouth at bedtime.    [provider]  Memantine HCl-Donepezil HCl (NAMZARIC) 28-10 MG CP24 Take 1 capsule by mouth daily.     [provider]   Social History   Social History  . Marital status: Widowed    Spouse name: N/A  . Number of children: N/A  . Years of education: N/A   Occupational History  . Not on file.   Social History Main Topics  . Smoking status: Former Research scientist (life sciences)  . Smokeless tobacco: Never Used  . Alcohol use No  . Drug use: No  . Sexual activity: Not on file   Other Topics Concern  . Not on file   Social History Narrative  . No narrative on file   Review of Systems  Constitutional: Positive for appetite change and fatigue. Negative for chills and fever.  Gastrointestinal:       Bowel incontinence  Genitourinary:       Urinary incontinence  Musculoskeletal: Positive for gait problem.  Skin: Negative for rash and wound.  Neurological: Positive for speech difficulty.  Psychiatric/Behavioral:       Altered mental state       Objective:   Physical Exam  Constitutional: She is oriented to person, place, and time. She appears well-developed and well-nourished. No distress.  HENT:  Head: Normocephalic and atraumatic.  Mouth/Throat: Mucous membranes are dry.  Dry mucus membranes  Eyes: EOM are normal. Pupils are equal, round, and reactive to light.  Neck: Neck supple.  Cardiovascular: Regular rhythm.  Tachycardia present.   Pulmonary/Chest: Effort normal. No respiratory distress.  Few coarse breath sounds on left side  Abdominal: There is no guarding.  Musculoskeletal: Normal range of motion. She exhibits no edema.  Neurological: She is alert and oriented to person, place, and time.  Equal facial movement, upper extremity movements, slow cautious gait, has tendency to walk forward, equal lower extremity movements  Skin: Skin is warm and dry.  Psychiatric: She has a normal mood and  affect. Her behavior is normal.  Nursing note and vitals reviewed.   Vitals:   03/04/17 1120  BP: 134/87  Pulse: (!) 110  Resp: 16  Temp: 98 F (36.7 C)  TempSrc: Oral  SpO2: 96%  Weight: 121 lb 6.4 oz (55.1 kg)  Height: 5\' 7"  (1.702 m)   Results for orders placed or performed in visit on 03/04/17  POCT CBC  Result Value Ref Range   WBC 10.1 4.6 - 10.2 K/uL   Lymph, poc 2.3 0.6 - 3.4   POC LYMPH PERCENT 22.3 10 - 50 %L   MID (cbc) 1.6 (A) 0 - 0.9   POC MID % 15.7 (A) 0 - 12 %M   POC Granulocyte 6.3 2 - 6.9   Granulocyte percent 62.0 37 - 80 %G   RBC 5.04 4.04 -  5.48 M/uL   Hemoglobin 13.5 12.2 - 16.2 g/dL   HCT, POC 39.9 37.7 - 47.9 %   MCV 79.2 (A) 80 - 97 fL   MCH, POC 26.8 (A) 27 - 31.2 pg   MCHC 33.8 31.8 - 35.4 g/dL   RDW, POC 14.2 %   Platelet Count, POC 274 142 - 424 K/uL   MPV 7.5 0 - 99.8 fL  POCT urinalysis dipstick  Result Value Ref Range   Color, UA yellow yellow   Clarity, UA cloudy (A) clear   Glucose, UA negative negative mg/dL   Bilirubin, UA small (A) negative   Ketones, POC UA negative negative mg/dL   Spec Grav, UA 1.020 1.010 - 1.025   Blood, UA negative negative   pH, UA 6.0 5.0 - 8.0   Protein Ur, POC =100 (A) negative mg/dL   Urobilinogen, UA 4.0 (A) 0.2 or 1.0 E.U./dL   Nitrite, UA Negative Negative   Leukocytes, UA Trace (A) Negative  POCT Microscopic Urinalysis ([facility])  Result Value Ref Range   WBC,UR,HPF,POC Moderate (A) None WBC/hpf   RBC,UR,HPF,POC None None RBC/hpf   Bacteria Moderate (A) None, Too numerous to count   Mucus Present (A) Absent   Epithelial Cells, UR Per Microscopy Moderate (A) None, Too numerous to count cells/hpf  POCT glucose (manual entry)  Result Value Ref Range   POC Glucose 93 70 - 99 mg/dl   Dg Chest 2 View  Result Date: 03/04/2017 CLINICAL DATA:  Altered mental status. Cough. History of pneumonia. EXAM: CHEST  2 VIEW COMPARISON:  12/08/2016 FINDINGS: There is hyperinflation of the lungs compatible with  COPD. Cardiomegaly. No confluent opacities or effusions. T4 compression fracture is stable since prior CT. New compression fracture at T7. IMPRESSION: Cardiomegaly.  COPD.  No active cardiopulmonary disease. Stable chronic T4 compression fracture. New moderate compression fracture at T7. Electronically Signed   By: Rolm Baptise M.D.   On: 03/04/2017 13:42        Assessment & Plan:  Mary Lamb is a 83 y.o. female Altered mental status, unspecified altered mental status type - Plan: POCT urinalysis dipstick, POCT Microscopic Urinalysis ([facility]), POCT glucose (manual entry), Comprehensive metabolic panel, Care order/instruction: Alzheimer's dementia without behavioral disturbance, unspecified timing of dementia onset,  Decreased oral intake Bacteriuria - Plan: Urine culture  - History of Alzheimer's dementia, with increased symptoms past 6 weeks. Some possible increased symptoms past 1 week. Has been evaluated for UTI previously during this decline which was negative by culture. May be due in part to volume depletion as decreased by mouth intake.  -Discussed 1 L of IV fluids delivered slowly, but daughter decided against at this point. Will try to continue Ensure and other fluids at home. RTC/ER precautions given  -Will check CMP, TSH as previous TSH elevated, although T4/T3 okay.   -Urinalysis in office with some bacteria, but also epithelial cells. Check urine culture to determine if UTI versus asymptomatic bacteruria.  - Would recommend follow-up through primary care provider, but if she is out of town and I can help in the meantime. Also may need to discuss with hospice as daughter brought up as this could be progression of her Alzheimer's.  -Discussed possible head CT with change in gait, but was decided with daughter to not check that as would not plan on other interventions if she had experienced a stroke.  Cough - Plan: POCT CBC, DG Chest 2 View, History of pneumonia  - Chronic changes seen on  chest  x-ray without acute infiltrate. Did note possible new compression fracture. Will discuss with daughter later this week, but if not in pain, may not need any other treatment at this point.  Elevated TSH - Plan: TSH, T4, Free  - As above, repeat testing.  Tachycardia - Plan: Comprehensive metabolic panel, TSH, T4, Free  -Check TSH, but may be related to decreased fluid intake as above. Did recommend holding enalapril if still taking that medication as it was recommended by cardiology with her decreased by mouth intake. We'll check renal function on CMP.  No orders of the defined types were placed in this encounter.  Patient Instructions   I will check kidney function, thyroid, other electrolytes. It appears that the cardiologist recommended stopping enalapril for now, especially if she is not drinking enough fluids.   Some symptoms currently appear to be due to volume depletion/dehydration. Small amount of IV fluid would be an option, but if you want can start with increasing oral fluids.   I will check urine culture to look for infection.   I would recommend Hospice discussion in case progression is due to dementia. This often come as a referral form primary care provider, but call them to see if a referral is needed.   [facility]  [address], [place], [place] [address]  [phone]  Follow up with Dr. Dillard Essex in next 2 weeks or sooner if possible.   Return to the clinic or go to the nearest emergency room if any of your symptoms worsen or new symptoms occur.    IF you received an x-ray today, you will receive an invoice from [facility]. Please contact [facility] at [phone] with questions or concerns regarding your invoice.   IF you received labwork today, you will receive an invoice from [facility]. Please contact [facility] at [phone] with questions or concerns regarding your invoice.   Our billing staff will not be  able to assist you with questions regarding bills from these companies.  You will be contacted with the lab results as soon as they are available. The fastest way to get your results is to activate your My Chart account. Instructions are located on the last page of this paperwork. If you have not heard from [place] regarding the results in 2 weeks, please contact this office.       I personally performed the services described in this documentation, which was scribed in my presence. The recorded information has been reviewed and considered for accuracy and completeness, addended by me as needed, and agree with information above.  Signed,   Merri Ray, MD [facility].  03/05/17 8:03 PM

## 2017-03-05 LAB — COMPREHENSIVE METABOLIC PANEL WITH GFR
ALT: 13 IU/L (ref 0–32)
AST: 26 IU/L (ref 0–40)
Albumin/Globulin Ratio: 1.4 (ref 1.2–2.2)
Albumin: 3.8 g/dL (ref 3.2–4.6)
Alkaline Phosphatase: 179 IU/L — ABNORMAL HIGH (ref 39–117)
BUN/Creatinine Ratio: 30 — ABNORMAL HIGH (ref 12–28)
BUN: 22 mg/dL (ref 10–36)
Bilirubin Total: 0.9 mg/dL (ref 0.0–1.2)
CO2: 26 mmol/L (ref 18–29)
Calcium: 9.3 mg/dL (ref 8.7–10.3)
Chloride: 100 mmol/L (ref 96–106)
Creatinine, Ser: 0.73 mg/dL (ref 0.57–1.00)
GFR calc Af Amer: 84 mL/min/1.73
GFR calc non Af Amer: 73 mL/min/1.73
Globulin, Total: 2.8 g/dL (ref 1.5–4.5)
Glucose: 87 mg/dL (ref 65–99)
Potassium: 3.2 mmol/L — ABNORMAL LOW (ref 3.5–5.2)
Sodium: 144 mmol/L (ref 134–144)
Total Protein: 6.6 g/dL (ref 6.0–8.5)

## 2017-03-05 LAB — URINE CULTURE

## 2017-03-05 LAB — TSH: TSH: 0.762 u[IU]/mL (ref 0.450–4.500)

## 2017-03-05 LAB — T4, FREE: Free T4: 2.22 ng/dL — ABNORMAL HIGH (ref 0.82–1.77)

## 2017-03-06 ENCOUNTER — Other Ambulatory Visit: Payer: Self-pay | Admitting: Family Medicine

## 2017-03-06 DIAGNOSIS — E876 Hypokalemia: Secondary | ICD-10-CM

## 2017-03-06 NOTE — Progress Notes (Signed)
Lab only visit for potassium.

## 2017-03-06 NOTE — Telephone Encounter (Signed)
Faxed both places

## 2017-03-06 NOTE — Telephone Encounter (Signed)
Discussed XR results with patient's daughter. Has had a few falls. Has had some discomfort front chest, but not complaining of back pain. RTC precautions if more back pain, Tylenol over-the-counter if needed, follow-up with primary care provider.  Please fax XR and lab reports, as well as office visit. Thanks.

## 2017-03-06 NOTE — Telephone Encounter (Signed)
What do you want me to fax? Tests? Ov?

## 2017-03-08 ENCOUNTER — Other Ambulatory Visit: Payer: Medicare Other | Admitting: Emergency Medicine

## 2017-03-08 DIAGNOSIS — E876 Hypokalemia: Secondary | ICD-10-CM

## 2017-03-09 LAB — POTASSIUM: POTASSIUM: 3.3 mmol/L — AB (ref 3.5–5.2)

## 2017-03-15 ENCOUNTER — Ambulatory Visit (INDEPENDENT_AMBULATORY_CARE_PROVIDER_SITE_OTHER): Payer: Medicare Other | Admitting: Cardiovascular Disease

## 2017-03-15 ENCOUNTER — Encounter: Payer: Self-pay | Admitting: Cardiovascular Disease

## 2017-03-15 VITALS — BP 142/88 | HR 101 | Ht 65.0 in | Wt 121.4 lb

## 2017-03-15 DIAGNOSIS — R634 Abnormal weight loss: Secondary | ICD-10-CM | POA: Diagnosis not present

## 2017-03-15 DIAGNOSIS — Z79899 Other long term (current) drug therapy: Secondary | ICD-10-CM | POA: Diagnosis not present

## 2017-03-15 DIAGNOSIS — E876 Hypokalemia: Secondary | ICD-10-CM

## 2017-03-15 DIAGNOSIS — R000 Tachycardia, unspecified: Secondary | ICD-10-CM | POA: Diagnosis not present

## 2017-03-15 DIAGNOSIS — E039 Hypothyroidism, unspecified: Secondary | ICD-10-CM | POA: Diagnosis not present

## 2017-03-15 DIAGNOSIS — I10 Essential (primary) hypertension: Secondary | ICD-10-CM | POA: Diagnosis not present

## 2017-03-15 MED ORDER — METOPROLOL TARTRATE 25 MG PO TABS: 12.5000 mg | ORAL_TABLET | Freq: Two times a day (BID) | ORAL | 3 refills | Status: AC

## 2017-03-15 NOTE — Patient Instructions (Signed)
Your physician has recommended you make the following change in your medication:   1.) start new lopressor prescription sent to your pharmacy as directed on the bottle.  Your physician recommends that you schedule a follow-up appointment in: 3-4 months.

## 2017-03-15 NOTE — Progress Notes (Signed)
Cardiology Office Note    Date:  03/17/2017   ID:  [name], DOB 01/16/1925, MRN [id number]  PCP:  Marylynn Pearson, MD  Cardiologist:  Shelva Majestic, MD   Chief Complaint  Patient presents with  . Follow-up    echo f/u   New Cardiology Evaluation  History of Present Illness:  Mary Lamb is a 83 y.o. female  who is brought here by her daughter and is a resident of [facility] memory unit.  She is followed by Dr. Marylynn Pearson. I saw her for initial cardiology evaluation on 02/10/2017.  She presents for one-month follow-up evaluation.  [name] has a history of hypertension, hypothyroidism, and has developed progressive dementia and recent weight loss.  According to her daughter, she has a history of hypertension for at least 10 years.  She has had significant decline in memory issues over the past 4 years esulting in her  move from the [place] and is been at [facility] [employer] for several years.  She has been on enalapril for hypertension for many years and has also been taking levothyroxine 125 g.  Over the last several months, her blood pressure had been increased and as result, she was started initially on metoprolol succinate 25 mg and ultimately this had been titrated up to 25 mg twice a day.  She has had significant recent weight loss, as well as poor appetite and has lost 16 pounds over the past 1-1/2 months.  Recently, her metoprolol has been reduced and tapered and since she has not been eating.  She had been changed to tartrate.  The patient cannot give a history.  During the office evaluation, the patient's daughter called Dr. Marylynn Pearson, so I was able to speak with her during this encounter and obtain more clinical history.  In January 2018, the patient's BUN was 26 and creatinine 0.86.  Her blood pressure had risen this year up to 676 systolic.  She was recently evaluated for possible UTI and had negative urinalysis and culture.  She was evaluated at urgent care and [facility] on  01/28/2017 and during that evaluation, she was hypertensive  with a blood pressure of 175/91.  The patient has never had a cardiac evaluation.  She also has noticed some chest wall discomfort.  She has a history of breast surgery and is status post mastectomy.  She had been sleeping often and has been very fatigued.  When I initially saw her, An Epworth Sleepiness Scale score was calculated in the office  and this endorsed at 24, which is maximal excessive daytime sleepiness.   At my initial evaluation, I was concerned that she was orthostatic and hypotensive and I recommended that enalapril be discontinued and initial holding of her beta blocker therapy.  She underwent an echo Doppler study on 02/23/2017.  This showed an EF of 60-65%.  There was grade 2 diastolic dysfunction and evidence for high ventricular filling pressure.  There was moderate mitral annular calcification, trivial TR and trivial PR.   Over the past month, she has felt improved.  She is more alert and less fatigued.  She denies orthostatic symptoms.  She presents for reevaluation.     Past Medical History:  Diagnosis Date  . Anemia   . Cancer ([place])   . Hypertension     Past Surgical History:  Procedure Laterality Date  . BREAST SURGERY    . EYE SURGERY      Current Medications: Outpatient Medications Prior to Visit  Medication  Sig Dispense Refill  . acetaminophen (TYLENOL) 500 MG tablet Take 500 mg by mouth every 6 (six) hours as needed.    Marland Kitchen buPROPion (WELLBUTRIN XL) 300 MG 24 hr tablet Take 300 mg by mouth daily.    . Cholecalciferol (VITAMIN D3) 1000 units CAPS Take 1,000 Units by mouth daily.     Marland Kitchen ibuprofen (ADVIL,MOTRIN) 200 MG tablet Take 400 mg by mouth every 6 (six) hours as needed.    Marland Kitchen levothyroxine (SYNTHROID, LEVOTHROID) 125 MCG tablet Take 125 mcg by mouth daily before breakfast.    . Melatonin 10 MG CAPS Take by mouth at bedtime.    . Memantine HCl-Donepezil HCl (NAMZARIC) 28-10 MG CP24 Take 1 capsule by  mouth daily.      No facility-administered medications prior to visit.      Allergies:   Patient has no known allergies.   Social History   Social History  . Marital status: Widowed    Spouse name: N/A  . Number of children: N/A  . Years of education: N/A   Social History Main Topics  . Smoking status: Former Research scientist (life sciences)  . Smokeless tobacco: Never Used  . Alcohol use No  . Drug use: No  . Sexual activity: Not Asked   Other Topics Concern  . None   Social History Narrative  . None    Additional social history is notable that she has 2 children and 9 grandchildren.  There is no alcohol use.  Family History:  T  family history includes Cancer in her brother, brother, mother, and sister; Stroke in her sister.  Her mother died at age 41 with colon cancer.  Her father died at 85 with a heart attack.  A brother died at 32 with heart problems.  2 other brothers died secondary to cancer.  A sister died at age 17 with breast cancer him another sister with stroke, and another sister at age 62 with stomach cancer.  The patient has 2 children, ages 34 and 49.  ROS General: Negative; No fevers, chills, or night sweats; positive for weight loss  HEENT: Negative; No changes in vision or hearing, sinus congestion, difficulty swallowing Pulmonary: Negative; No cough, wheezing, shortness of breath, hemoptysis Cardiovascular:  See HPI GI: Negative; No nausea, vomiting, diarrhea, or abdominal pain GU: Negative; No dysuria, hematuria, or difficulty voiding Musculoskeletal: Negative; no myalgias, joint pain, or weakness Hematologic/Oncology: Negative; no easy bruising, bleeding Endocrine: Positive for hypothyroidism on levothyroxine replacement Neuro: Positive for Alzheimer's disease Skin: Negative; No rashes or skin lesions Psychiatric: Negative; No behavioral problems, depression Sleep: Positive for significant daytime sleepiness Other comprehensive 14 point system review is  negative.   PHYSICAL EXAM:   VS:  BP (!) 142/88   Pulse (!) 101   Ht '5\' 5"'  (1.651 m)   Wt 121 lb 6.4 oz (55.1 kg)   BMI 20.20 kg/m     Repeat blood pressure by me was 140/90 supine and 150/88 sitting  Wt Readings from Last 3 Encounters:  03/15/17 121 lb 6.4 oz (55.1 kg)  03/04/17 121 lb 6.4 oz (55.1 kg)  02/10/17 125 lb 9.6 oz (57 kg)    General: Alert, oriented, no distress.  Much more alert and less somnolent from her initial evaluation. Skin: normal turgor, no rashes, warm and dry HEENT: Normocephalic, atraumatic. Pupils equal round and reactive to light; sclera anicteric; extraocular muscles intact;  Nose without nasal septal hypertrophy Mouth/Parynx benign; Mallinpatti scale 3 Neck: No JVD, no carotid bruits; normal carotid upstroke  Lungs: clear to ausculatation and percussion; no wheezing or rales Chest wall: without tenderness to palpitation Heart: PMI not displaced, RRR, s1 s2 normal, 1/6 systolic murmur, no diastolic murmur, no rubs, gallops, thrills, or heaves Abdomen: soft, nontender; no hepatosplenomehaly, BS+; abdominal aorta nontender and not dilated by palpation. Back: no CVA tenderness Pulses 2+ Musculoskeletal: full range of motion, normal strength, no joint deformities Extremities: no clubbing cyanosis or edema, Homan's sign negative  Neurologic: grossly nonfocal; Cranial nerves grossly wnl Psychologic: More alert and responsive    Studies/Labs Reviewed:   EKG:  EKG is ordered today.  Sinus tachycardia 101 bpm.  Borderline LVH by voltage.  QTc interval 446 ms.  No significant ST-T changes  02/10/2017 ECG (independently read by me): Sinus tachycardia 103 bpm.  Possible left atrial enlargement.  Nonspecific ST changes.  QTc interval 442 ms.  Recent Labs: BMP Latest Ref Rng & Units 03/08/2017 03/04/2017 02/10/2017  Glucose 65 - 99 mg/dL - 87 95  BUN 10 - 36 mg/dL - 22 25  Creatinine 0.57 - 1.00 mg/dL - 0.73 0.95(H)  BUN/Creat Ratio 12 - 28 - 30(H) -   Sodium 134 - 144 mmol/L - 144 142  Potassium 3.5 - 5.2 mmol/L 3.3(L) 3.2(L) 3.9  Chloride 96 - 106 mmol/L - 100 103  CO2 18 - 29 mmol/L - 26 24  Calcium 8.7 - 10.3 mg/dL - 9.3 9.4     Hepatic Function Latest Ref Rng & Units 03/04/2017 02/10/2017 12/08/2016  Total Protein 6.0 - 8.5 g/dL 6.6 6.3 6.4(L)  Albumin 3.2 - 4.6 g/dL 3.8 3.5(L) 3.8  AST 0 - 40 IU/L '26 24 29  ' ALT 0 - 32 IU/L '13 18 20  ' Alk Phosphatase 39 - 117 IU/L 179(H) 105 80  Total Bilirubin 0.0 - 1.2 mg/dL 0.9 1.0 1.1    CBC Latest Ref Rng & Units 03/04/2017 02/10/2017 12/08/2016  WBC 4.6 - 10.2 K/uL 10.1 11.6(H) 10.4  Hemoglobin 12.2 - 16.2 g/dL 13.5 14.5 12.5  Hematocrit 37.7 - 47.9 % 39.9 45.8(H) 37.9  Platelets 140 - 400 K/uL - 305 188   Lab Results  Component Value Date   MCV 79.2 (A) 03/04/2017   MCV 82.8 02/10/2017   MCV 80.3 12/08/2016   Lab Results  Component Value Date   TSH 0.762 03/04/2017   No results found for: HGBA1C   BNP No results found for: BNP  ProBNP No results found for: PROBNP   Lipid Panel  No results found for: CHOL, TRIG, HDL, CHOLHDL, VLDL, LDLCALC, LDLDIRECT   RADIOLOGY: Dg Chest 2 View  Result Date: 03/04/2017 CLINICAL DATA:  Altered mental status. Cough. History of pneumonia. EXAM: CHEST  2 VIEW COMPARISON:  12/08/2016 FINDINGS: There is hyperinflation of the lungs compatible with COPD. Cardiomegaly. No confluent opacities or effusions. T4 compression fracture is stable since prior CT. New compression fracture at T7. IMPRESSION: Cardiomegaly.  COPD.  No active cardiopulmonary disease. Stable chronic T4 compression fracture. New moderate compression fracture at T7. Electronically Signed   By: Rolm Baptise M.D.   On: 03/04/2017 13:42     Additional studies/ records that were reviewed today include:  I reviewed the her to screen [facility] list.  I have contacted Dr. Marylynn Pearson during the evaluation to discuss her recent history and laboratory.  I reviewed her recent evaluation at  urgent care at [place]:    1. Tachycardia   2. Hypothyroidism, unspecified type   3. Medication management   4. Weight  loss, abnormal   5. Essential hypertension   6. Hypokalemia      PLAN:  Ms. Genella Bas is a 83 year old female who has a history of hypertension, macular degeneration, hypothyroidism and progressive memory decline.  When I initially saw her one month ago, she had,  reduced intake and according to her daughter has lost 16 pounds of weight.  She had not been eating well.  There also has been more somnolence.  She has had some blood pressure lability, necessitating initiation of beta blocker therapy initially with metoprolol, succinate 25 mg twice a day, but ultimately this has been reduced.  When I initially saw her, she was hypotensive with a blood pressure of 96/60, which dropped to 84/60 in the sitting position and she was unable to stand.  I discontinued enalapril and weaned and discontinued blocker therapy.  Her blood pressure today is now stable and she is not orthostatic.  She has sinus tachycardia with a rate of 101.  I have recommended she stay off enalapril.  I will resume very low-dose metoprolol at 12.5 mg twice a day.  I reviewed her echo Doppler data which shows normal systolic function with grade 2 diastolic dysfunction and high ventricular filling pressures.  She will continue to have her heart rate and blood pressure monitored.  She is not having any chest pain.   She continues to take levothyroxine at 125 ug.  Recent potassium was low at 3.3 and dietary modification was recommended and she is planned to have this rechecked within the next week by her primary physician.  I will see her in 4 months for reevaluation.     Medication Adjustments/Labs and Tests Ordered: Current medicines are reviewed at length with the patient today.  Concerns regarding medicines are outlined above.  Medication changes, Labs and Tests ordered today are listed in the Patient  Instructions below. Patient Instructions  Your physician has recommended you make the following change in your medication:   1.) start new lopressor prescription sent to your pharmacy as directed on the bottle.  Your physician recommends that you schedule a follow-up appointment in: 3-4 months.      Dr Dillard Essex - fax [phone]  Signed, Shelva Majestic, MD , [facility] 03/17/2017 3:36 PM    [facility] [address], [place], [place], [place]  [address] Phone: [phone]

## 2017-04-25 ENCOUNTER — Ambulatory Visit (INDEPENDENT_AMBULATORY_CARE_PROVIDER_SITE_OTHER): Payer: Medicare Other | Admitting: Emergency Medicine

## 2017-04-25 ENCOUNTER — Encounter: Payer: Self-pay | Admitting: Emergency Medicine

## 2017-04-25 VITALS — BP 109/70 | HR 86 | Temp 97.3°F | Resp 18

## 2017-04-25 DIAGNOSIS — N390 Urinary tract infection, site not specified: Secondary | ICD-10-CM | POA: Diagnosis not present

## 2017-04-25 DIAGNOSIS — R531 Weakness: Secondary | ICD-10-CM | POA: Diagnosis not present

## 2017-04-25 LAB — POCT URINALYSIS DIP (MANUAL ENTRY)
Bilirubin, UA: NEGATIVE
Glucose, UA: NEGATIVE mg/dL
Ketones, POC UA: NEGATIVE mg/dL
Nitrite, UA: POSITIVE — AB
PH UA: 5.5 (ref 5.0–8.0)
UROBILINOGEN UA: 1 U/dL

## 2017-04-25 MED ORDER — CEFADROXIL 500 MG/5ML PO SUSR: 500.0000 mg | Freq: Two times a day (BID) | ORAL | 0 refills | Status: AC

## 2017-04-25 NOTE — Progress Notes (Signed)
Mary Lamb 83 y.o.   Chief Complaint  Patient presents with  . Thyroid Problem    per daughter eyes looked swollen yesterdau  . Extremity Weakness    both and unable to stand and patient unable to answer DEPRESSION check  she does not speak    HISTORY OF PRESENT ILLNESS: This is a 83 y.o. female brought in by daughter who states patient has been functionally declining over the last 2 days; eating less and refusing to use walker; resident of nursing home; concerned about thyroid and possible UTI; no fever, SOB, chest pain, n/v, or any other obvious symptoms.  HPI   Prior to Admission medications   Medication Sig Start Date End Date Taking? Authorizing Provider  buPROPion (WELLBUTRIN XL) 300 MG 24 hr tablet Take 300 mg by mouth daily.   Yes [provider]  Cholecalciferol (VITAMIN D3) 1000 units CAPS Take 1,000 Units by mouth daily.    Yes [provider]  ibuprofen (ADVIL,MOTRIN) 200 MG tablet Take 400 mg by mouth every 6 (six) hours as needed.   Yes [provider]  levothyroxine (SYNTHROID, LEVOTHROID) 125 MCG tablet Take 125 mcg by mouth daily before breakfast.   Yes [provider]  Melatonin 10 MG CAPS Take by mouth at bedtime.   Yes [provider]  metoprolol tartrate (LOPRESSOR) 25 MG tablet Take 0.5 tablets (12.5 mg total) by mouth 2 (two) times daily. 03/15/17 06/13/17 Yes Troy Sine, MD  acetaminophen (TYLENOL) 500 MG tablet Take 500 mg by mouth every 6 (six) hours as needed.    [provider]  Memantine HCl-Donepezil HCl (NAMZARIC) 28-10 MG CP24 Take 1 capsule by mouth daily.     [provider]    No Known Allergies  There are no active problems to display for this patient.   Past Medical History:  Diagnosis Date  . Anemia   . Cancer ([place])   . Hypertension     Past Surgical History:  Procedure Laterality Date  . BREAST SURGERY    . EYE SURGERY      Social History   Social History  .  Marital status: Widowed    Spouse name: N/A  . Number of children: N/A  . Years of education: N/A   Occupational History  . Not on file.   Social History Main Topics  . Smoking status: Former Research scientist (life sciences)  . Smokeless tobacco: Never Used  . Alcohol use No  . Drug use: No  . Sexual activity: Not on file   Other Topics Concern  . Not on file   Social History Narrative  . No narrative on file    Family History  Problem Relation Age of Onset  . Cancer Mother   . Cancer Sister   . Cancer Brother   . Cancer Brother   . Stroke Sister      Review of Systems  Unable to perform ROS: Dementia   Vitals:   04/25/17 1130  BP: 109/70  Pulse: 86  Resp: 18  Temp: (!) 97.3 F (36.3 C)     Physical Exam  Constitutional: She appears well-developed and well-nourished.  HENT:  Head: Normocephalic and atraumatic.  Nose: Nose normal.  Mouth/Throat: Oropharynx is clear and moist.  Eyes: Conjunctivae and EOM are normal. Pupils are equal, round, and reactive to light.  Neck: Normal range of motion. Neck supple. No JVD present.  Cardiovascular: Normal rate, regular rhythm and normal heart sounds.   Pulmonary/Chest: Effort normal and breath  sounds normal.  Abdominal: Soft. She exhibits no distension. There is no tenderness.  Lymphadenopathy:    She has no cervical adenopathy.  Neurological: She exhibits normal muscle tone.  Awake; responds to some verbal stimuli; follows some commands.  Skin: Skin is warm and dry. Capillary refill takes less than 2 seconds. No rash noted.  Vitals reviewed.  Results for orders placed or performed in visit on 04/25/17 (from the past 24 hour(s))  POCT urinalysis dipstick     Status: Abnormal   Collection Time: 04/25/17 12:33 PM  Result Value Ref Range   Color, UA yellow yellow   Clarity, UA cloudy (A) clear   Glucose, UA negative negative mg/dL   Bilirubin, UA negative negative   Ketones, POC UA negative negative mg/dL   Spec Grav, UA >=1.030 (A)  1.010 - 1.025   Blood, UA large (A) negative   pH, UA 5.5 5.0 - 8.0   Protein Ur, POC =30 (A) negative mg/dL   Urobilinogen, UA 1.0 0.2 or 1.0 E.U./dL   Nitrite, UA Positive (A) Negative   Leukocytes, UA Large (3+) (A) Negative     ASSESSMENT & PLAN: Asmi was seen today for thyroid problem and extremity weakness.  Diagnoses and all orders for this visit:  General weakness -     CBC with Differential/Platelet -     Comprehensive metabolic panel -     Thyroid Profile -     POCT urinalysis dipstick -     Urine Culture  Acute UTI  Other orders -     cefadroxil (DURICEF) 500 MG/5ML suspension; Take 5 mLs (500 mg total) by mouth 2 (two) times daily.    Patient Instructions   pc    IF you received an x-ray today, you will receive an invoice from [facility]. Please contact [facility] at [phone] with questions or concerns regarding your invoice.   IF you received labwork today, you will receive an invoice from [facility]. Please contact [facility] at [phone] with questions or concerns regarding your invoice.   Our billing staff will not be able to assist you with questions regarding bills from these companies.  You will be contacted with the lab results as soon as they are available. The fastest way to get your results is to activate your My Chart account. Instructions are located on the last page of this paperwork. If you have not heard from [place] regarding the results in 2 weeks, please contact this office.     Urinary Tract Infection, Adult A urinary tract infection (UTI) is an infection of any part of the urinary tract. The urinary tract includes the:  Kidneys.  Ureters.  Bladder.  Urethra.  These organs make, store, and get rid of pee (urine) in the body. Follow these instructions at home:  Take over-the-counter and prescription medicines only as told by your doctor.  If you were prescribed an antibiotic medicine, take it as told by your  doctor. Do not stop taking the antibiotic even if you start to feel better.  Avoid the following drinks: ? Alcohol. ? Caffeine. ? Tea. ? Carbonated drinks.  Drink enough fluid to keep your pee clear or pale yellow.  Keep all follow-up visits as told by your doctor. This is important.  Make sure to: ? Empty your bladder often and completely. Do not to hold pee for long periods of time. ? Empty your bladder before and after sex. ? Wipe from front to back after a bowel movement if you  are female. Use each tissue one time when you wipe. Contact a doctor if:  You have back pain.  You have a fever.  You feel sick to your stomach (nauseous).  You throw up (vomit).  Your symptoms do not get better after 3 days.  Your symptoms go away and then come back. Get help right away if:  You have very bad back pain.  You have very bad lower belly (abdominal) pain.  You are throwing up and cannot keep down any medicines or water. This information is not intended to replace advice given to you by your health care provider. Make sure you discuss any questions you have with your health care provider. Document Released: 03/21/2008 Document Revised: 03/10/2016 Document Reviewed: 08/24/2015 Elsevier Interactive Patient Education  2018 Elsevier Inc.      Agustina Caroli, MD [facility]

## 2017-04-25 NOTE — Patient Instructions (Addendum)
pc    IF you received an x-ray today, you will receive an invoice from [facility]. Please contact [facility] at [phone] with questions or concerns regarding your invoice.   IF you received labwork today, you will receive an invoice from [place]. Please contact [facility] at [phone] with questions or concerns regarding your invoice.   Our billing staff will not be able to assist you with questions regarding bills from these companies.  You will be contacted with the lab results as soon as they are available. The fastest way to get your results is to activate your My Chart account. Instructions are located on the last page of this paperwork. If you have not heard from [place] regarding the results in 2 weeks, please contact this office.     Urinary Tract Infection, Adult A urinary tract infection (UTI) is an infection of any part of the urinary tract. The urinary tract includes the:  Kidneys.  Ureters.  Bladder.  Urethra.  These organs make, store, and get rid of pee (urine) in the body. Follow these instructions at home:  Take over-the-counter and prescription medicines only as told by your doctor.  If you were prescribed an antibiotic medicine, take it as told by your doctor. Do not stop taking the antibiotic even if you start to feel better.  Avoid the following drinks: ? Alcohol. ? Caffeine. ? Tea. ? Carbonated drinks.  Drink enough fluid to keep your pee clear or pale yellow.  Keep all follow-up visits as told by your doctor. This is important.  Make sure to: ? Empty your bladder often and completely. Do not to hold pee for long periods of time. ? Empty your bladder before and after sex. ? Wipe from front to back after a bowel movement if you are female. Use each tissue one time when you wipe. Contact a doctor if:  You have back pain.  You have a fever.  You feel sick to your stomach (nauseous).  You throw up (vomit).  Your symptoms  do not get better after 3 days.  Your symptoms go away and then come back. Get help right away if:  You have very bad back pain.  You have very bad lower belly (abdominal) pain.  You are throwing up and cannot keep down any medicines or water. This information is not intended to replace advice given to you by your health care provider. Make sure you discuss any questions you have with your health care provider. Document Released: 03/21/2008 Document Revised: 03/10/2016 Document Reviewed: 08/24/2015 Elsevier Interactive Patient Education  [employer].

## 2017-04-26 ENCOUNTER — Encounter: Payer: Self-pay | Admitting: *Deleted

## 2017-04-26 LAB — COMPREHENSIVE METABOLIC PANEL
ALBUMIN: 4 g/dL (ref 3.2–4.6)
ALK PHOS: 114 IU/L (ref 39–117)
ALT: 15 IU/L (ref 0–32)
AST: 26 IU/L (ref 0–40)
Albumin/Globulin Ratio: 1.7 (ref 1.2–2.2)
BUN/Creatinine Ratio: 30 — ABNORMAL HIGH (ref 12–28)
BUN: 22 mg/dL (ref 10–36)
Bilirubin Total: 0.8 mg/dL (ref 0.0–1.2)
CALCIUM: 9.4 mg/dL (ref 8.7–10.3)
CO2: 22 mmol/L (ref 20–29)
CREATININE: 0.73 mg/dL (ref 0.57–1.00)
Chloride: 100 mmol/L (ref 96–106)
GFR calc Af Amer: 84 mL/min/{1.73_m2} (ref >59)
GFR, EST NON AFRICAN AMERICAN: 73 mL/min/{1.73_m2} (ref >59)
GLUCOSE: 98 mg/dL (ref 65–99)
Globulin, Total: 2.3 g/dL (ref 1.5–4.5)
Potassium: 4.1 mmol/L (ref 3.5–5.2)
Sodium: 138 mmol/L (ref 134–144)
Total Protein: 6.3 g/dL (ref 6.0–8.5)

## 2017-04-26 LAB — CBC WITH DIFFERENTIAL/PLATELET
BASOS ABS: 0 10*3/uL (ref 0.0–0.2)
Basos: 0 %
EOS (ABSOLUTE): 0.1 10*3/uL (ref 0.0–0.4)
EOS: 1 %
HEMOGLOBIN: 12.5 g/dL (ref 11.1–15.9)
Hematocrit: 39.1 % (ref 34.0–46.6)
IMMATURE GRANULOCYTES: 0 %
Immature Grans (Abs): 0 10*3/uL (ref 0.0–0.1)
LYMPHS ABS: 1.1 10*3/uL (ref 0.7–3.1)
Lymphs: 13 %
MCH: 25.9 pg — ABNORMAL LOW (ref 26.6–33.0)
MCHC: 32 g/dL (ref 31.5–35.7)
MCV: 81 fL (ref 79–97)
MONOCYTES: 17 %
Monocytes Absolute: 1.4 10*3/uL — ABNORMAL HIGH (ref 0.1–0.9)
NEUTROS PCT: 69 %
Neutrophils Absolute: 5.5 10*3/uL (ref 1.4–7.0)
Platelets: 227 10*3/uL (ref 150–379)
RBC: 4.83 x10E6/uL (ref 3.77–5.28)
RDW: 15.5 % — AB (ref 12.3–15.4)
WBC: 8.1 10*3/uL (ref 3.4–10.8)

## 2017-04-26 LAB — THYROID PANEL
Free Thyroxine Index: 3.5 (ref 1.2–4.9)
T3 UPTAKE RATIO: 36 % (ref 24–39)
T4 TOTAL: 9.6 ug/dL (ref 4.5–12.0)

## 2017-04-29 LAB — URINE CULTURE

## 2017-05-02 ENCOUNTER — Encounter: Payer: Self-pay | Admitting: Radiology

## 2017-05-03 NOTE — Progress Notes (Signed)
If she's not better, needs OV.

## 2017-07-05 ENCOUNTER — Ambulatory Visit (INDEPENDENT_AMBULATORY_CARE_PROVIDER_SITE_OTHER): Admitting: Cardiovascular Disease

## 2017-07-05 ENCOUNTER — Encounter: Payer: Self-pay | Admitting: Cardiovascular Disease

## 2017-07-05 VITALS — BP 110/68 | HR 92 | Ht 67.0 in

## 2017-07-05 DIAGNOSIS — E039 Hypothyroidism, unspecified: Secondary | ICD-10-CM | POA: Diagnosis not present

## 2017-07-05 DIAGNOSIS — F0390 Unspecified dementia without behavioral disturbance: Secondary | ICD-10-CM

## 2017-07-05 DIAGNOSIS — I10 Essential (primary) hypertension: Secondary | ICD-10-CM

## 2017-07-05 DIAGNOSIS — R634 Abnormal weight loss: Secondary | ICD-10-CM | POA: Diagnosis not present

## 2017-07-05 NOTE — Patient Instructions (Signed)
Medication Instructions:  Your physician recommends that you continue on your current medications as directed. Please refer to the Current Medication list given to you today.  Follow-Up: AS NEEDED with Dr. Kelly.  Any Other Special Instructions Will Be Listed Below (If Applicable).     If you need a refill on your cardiac medications before your next appointment, please call your pharmacy.   

## 2017-07-05 NOTE — Progress Notes (Signed)
Cardiology Office Note    Date:  07/07/2017   ID:  Mary Lamb, DOB 21-Jun-1925, MRN [id number]  PCP:  Mary Pearson, MD  Cardiologist:  Shelva Majestic, MD   Chief Complaint  Lamb presents with  . Follow-up    4 months    History of Present Illness:  Mary Lamb is a 83 y.o. female  who is brought here by her daughter and is a resident of [facility] memory unit.  She is followed by Dr. Marylynn Lamb. I saw her for initial cardiology evaluation on 02/10/2017 and follow-up one month later.  She presents for 4 month follow-up evaluation.  Mary Lamb has a history of hypertension, hypothyroidism, and has developed progressive dementia and recent weight loss.  According to her daughter, she has a history of hypertension for at least 10 years.  She has had significant decline in memory issues over Mary past 4 years esulting in her  move from [place] and is been at [employer] for several years.  She has been on enalapril for hypertension for many years and has also been taking levothyroxine 125 g.  Over Mary last several months, her blood pressure had been increased and as result, she was started initially on metoprolol succinate 25 mg and ultimately this had been titrated up to 25 mg twice a day.  She has had significant recent weight loss, as well as poor appetite and has lost 16 pounds over Mary past 1-1/2 months.  Recently, her metoprolol has been reduced and tapered and since she has not been eating.  She had been changed to tartrate.  Mary Lamb cannot give a history.  During Mary office evaluation, Mary Lamb's daughter called Dr. Marylynn Lamb, so I was able to speak with her during this encounter and obtain more clinical history.  In January 2018, Mary Lamb's BUN was 26 and creatinine 0.86.  Her blood pressure had risen this year up to 417 systolic.  She was recently evaluated for possible UTI and had negative urinalysis and culture.  She was evaluated at urgent care and Mary [facility] on  01/28/2017 and during that evaluation, she was hypertensive  with a blood pressure of 175/91.  Mary Lamb has never had a cardiac evaluation.  She also has noticed some chest wall discomfort.  She has a history of breast surgery and is status post mastectomy.  She had been sleeping often and has been very fatigued.  When I initially saw her, An Epworth Sleepiness Scale score was calculated in Mary office  and this endorsed at 24, which is maximal excessive daytime sleepiness.   At my initial evaluation, I was concerned that she was orthostatic and hypotensive and I recommended that enalapril be discontinued and initial holding of her beta blocker therapy.  She underwent an echo Doppler study on 02/23/2017.  This showed an EF of 60-65%.  There was grade 2 diastolic dysfunction and evidence for high ventricular filling pressure.  There was moderate mitral annular calcification, trivial TR and trivial PR.   When I saw him one month later in follow-up she felt  Improved. She was more alert and less fatigued.  She denies orthostatic symptoms.    Over Mary past several months, she denies orthostatic symptoms.  According to her daughter.  She has continued to lose weight and is failing to thrive.  Her dementia is getting worse.  Apparently, she had a chest x-ray 2 days ago and was notified that there was no evidence for pneumonia.  She denies palpitations.  She denies chest pressure.  She also has had issues with urinary infections.  She presents for follow-up evaluation.   Past Medical History:  Diagnosis Date  . Anemia   . Cancer ([place])   . Hypertension     Past Surgical History:  Procedure Laterality Date  . BREAST SURGERY    . EYE SURGERY      Current Medications: Outpatient Medications Prior to Visit  Medication Sig Dispense Refill  . acetaminophen (TYLENOL) 500 MG tablet Take 500 mg by mouth every 6 (six) hours as needed.    Marland Kitchen buPROPion (WELLBUTRIN XL) 300 MG 24 hr tablet Take 300 mg by mouth  daily.    . Cholecalciferol (VITAMIN D3) 1000 units CAPS Take 1,000 Units by mouth daily.     Marland Kitchen ibuprofen (ADVIL,MOTRIN) 200 MG tablet Take 400 mg by mouth every 6 (six) hours as needed.    . Melatonin 10 MG CAPS Take by mouth at bedtime.    . Memantine HCl-Donepezil HCl (NAMZARIC) 28-10 MG CP24 Take 1 capsule by mouth daily.     Marland Kitchen levothyroxine (SYNTHROID, LEVOTHROID) 125 MCG tablet Take 125 mcg by mouth daily before breakfast.    . metoprolol tartrate (LOPRESSOR) 25 MG tablet Take 0.5 tablets (12.5 mg total) by mouth 2 (two) times daily. 90 tablet 3   No facility-administered medications prior to visit.      Allergies:   Lamb has no known allergies.   Social History   Social History  . Marital status: Widowed    Spouse name: N/A  . Number of children: N/A  . Years of education: N/A   Social History Main Topics  . Smoking status: Former Research scientist (life sciences)  . Smokeless tobacco: Never Used  . Alcohol use No  . Drug use: No  . Sexual activity: Not Asked   Other Topics Concern  . None   Social History Narrative  . None    Additional social history is notable that she has 2 children and 9 grandchildren.  There is no alcohol use.  Family History:  T  family history includes Cancer in her brother, brother, mother, and sister; Stroke in her sister.  Her mother died at age 35 with colon cancer.  Her father died at 68 with a heart attack.  A brother died at 40 with heart problems.  2 other brothers died secondary to cancer.  A sister died at age 71 with breast cancer him another sister with stroke, and another sister at age 69 with stomach cancer.  Mary Lamb has 2 children, ages 57 and 56.  ROS General: Negative; No fevers, chills, or night sweats; positive for weight loss  HEENT: Negative; No changes in vision or hearing, sinus congestion, difficulty swallowing Pulmonary: Negative; No cough, wheezing, shortness of breath, hemoptysis Cardiovascular:  See HPI GI: Negative; No nausea,  vomiting, diarrhea, or abdominal pain GU: Negative; No dysuria, hematuria, or difficulty voiding Musculoskeletal: Negative; no myalgias, joint pain, or weakness Hematologic/Oncology: Negative; no easy bruising, bleeding Endocrine: Positive for hypothyroidism on levothyroxine replacement Neuro: Positive for Alzheimer's disease Skin: Negative; No rashes or skin lesions Psychiatric: Negative; No behavioral problems, depression Sleep: Positive for significant daytime sleepiness Other comprehensive 14 point system review is negative.   PHYSICAL EXAM:   VS:  BP 110/68   Pulse 92   Ht '5\' 7"'  (1.702 m)     Wt Readings from Last 3 Encounters:  03/15/17 121 lb 6.4 oz (55.1 kg)  03/04/17 121 lb 6.4  oz (55.1 kg)  02/10/17 125 lb 9.6 oz (57 kg)      Physical Exam BP 110/68   Pulse 92   Ht '5\' 7"'  (1.702 m)  General: Alert, oriented, no distress.  Skin: normal turgor, no rashes, warm and dry HEENT: Normocephalic, atraumatic. Pupils equal round and reactive to light; sclera anicteric; extraocular muscles intact;  Nose without nasal septal hypertrophy Mouth/Parynx benign; Mallinpatti scale 3 Neck: No JVD, no carotid bruits; normal carotid upstroke Lungs: clear to ausculatation and percussion; no wheezing or rales Chest wall: without tenderness to palpitation Heart: PMI not displaced, RRR, s1 s2 normal, 1/6 systolic murmur, no diastolic murmur, no rubs, gallops, thrills, or heaves Abdomen: soft, nontender; no hepatosplenomehaly, BS+; abdominal aorta nontender and not dilated by palpation. Back: no CVA tenderness Pulses 2+ Musculoskeletal: full range of motion, normal strength, no joint deformities Extremities: no clubbing cyanosis or edema, Homan's sign negative  Neurologic: grossly nonfocal; Cranial nerves grossly wnl Psychologic: Flat affect; but smiling   Studies/Labs Reviewed:   EKG:  EKG is ordered today.  ECG (independently read by me): normal sinus rhythm at 92 bpm.  Nonspecific  T-wave changes.  QTc interval 432 ms.  PR interval 130 milliseconds.  Mar 15, 2017 ECG (independently read by me): Sinus tachycardia 101 bpm.  Borderline LVH by voltage.  QTc interval 446 ms.  No significant ST-T changes  02/10/2017 ECG (independently read by me): Sinus tachycardia 103 bpm.  Possible left atrial enlargement.  Nonspecific ST changes.  QTc interval 442 ms.  Recent Labs: BMP Latest Ref Rng & Units 04/25/2017 03/08/2017 03/04/2017  Glucose 65 - 99 mg/dL 98 - 87  BUN 10 - 36 mg/dL 22 - 22  Creatinine 0.57 - 1.00 mg/dL 0.73 - 0.73  BUN/Creat Ratio 12 - 28 30(H) - 30(H)  Sodium 134 - 144 mmol/L 138 - 144  Potassium 3.5 - 5.2 mmol/L 4.1 3.3(L) 3.2(L)  Chloride 96 - 106 mmol/L 100 - 100  CO2 20 - 29 mmol/L 22 - 26  Calcium 8.7 - 10.3 mg/dL 9.4 - 9.3     Hepatic Function Latest Ref Rng & Units 04/25/2017 03/04/2017 02/10/2017  Total Protein 6.0 - 8.5 g/dL 6.3 6.6 6.3  Albumin 3.2 - 4.6 g/dL 4.0 3.8 3.5(L)  AST 0 - 40 IU/L '26 26 24  ' ALT 0 - 32 IU/L '15 13 18  ' Alk Phosphatase 39 - 117 IU/L 114 179(H) 105  Total Bilirubin 0.0 - 1.2 mg/dL 0.8 0.9 1.0    CBC Latest Ref Rng & Units 04/25/2017 03/04/2017 02/10/2017  WBC 3.4 - 10.8 x10E3/uL 8.1 10.1 11.6(H)  Hemoglobin 11.1 - 15.9 g/dL 12.5 13.5 14.5  Hematocrit 34.0 - 46.6 % 39.1 39.9 45.8(H)  Platelets 150 - 379 x10E3/uL 227 - 305   Lab Results  Component Value Date   MCV 81 04/25/2017   MCV 79.2 (A) 03/04/2017   MCV 82.8 02/10/2017   Lab Results  Component Value Date   TSH 0.762 03/04/2017   No results found for: HGBA1C   BNP No results found for: BNP  ProBNP No results found for: PROBNP   Lipid Panel  No results found for: CHOL, TRIG, HDL, CHOLHDL, VLDL, LDLCALC, LDLDIRECT   RADIOLOGY: No results found.   Additional studies/ records that were reviewed today include:  I reviewed Mary her to screen [facility] list.  I have contacted Dr. Marylynn Lamb during Mary evaluation to discuss her recent history and laboratory.  I  reviewed her recent evaluation at urgent care  at [place]:    1. Essential hypertension   2. Rapidly progressive dementia   3. Weight loss, abnormal   4. Hypothyroidism, unspecified type      PLAN:  Ms. Anely Lamb is a 83 year old female who has a history of hypertension, macular degeneration, hypothyroidism and progressive memory decline.  When I initially saw her  she had reduced intake and according to her daughter has lost 16 pounds of weight.  She had not been eating well.  There also has been more somnolence.  She has had some blood pressure lability, necessitating initiation of beta blocker therapy initially with metoprolol, succinate 25 mg twice a day, but ultimately this has been reduced.  When I initially saw her, she was hypotensive with a blood pressure of 96/60, which dropped to 84/60 in Mary sitting position and she was unable to stand.  I discontinued enalapril and weaned and discontinued blocker therapy. Her echo Doppler data demonstrated normal systolic function with grade 2 diastolic dysfunction and high ventricular filling pressures.  She is no longer orthostatic.  Clinically, she is doing well from a cardiac standpoint.  She is tolerating low-dose metoprolol, tartrate at 12.5 mg twice a.  She is in a wheelchair and does not walk hardly at all.  She continues to be on levothyroxine.  She has had urinary tract infection issues.  She also had recent congestion, but a chest x-ray did not show pneumonia.  Cardiac wise she is fairly stable.  Per family request I will see her on an as-needed basis or if problems develop.  Medication Adjustments/Labs and Tests Ordered: Current medicines are reviewed at length with Mary Lamb today.  Concerns regarding medicines are outlined above.  Medication changes, Labs and Tests ordered today are listed in Mary Lamb Instructions below. Lamb Instructions  Medication Instructions:  Your physician recommends that you continue on your  current medications as directed. Please refer to Mary Current Medication list given to you today.  Follow-Up: AS NEEDED with Dr. Claiborne Billings  Any Other Special Instructions Will Be Listed Below (If Applicable).     If you need a refill on your cardiac medications before your next appointment, please call your pharmacy.      Dr Dillard Essex - fax [phone]  Signed, Shelva Majestic, MD , [facility] 07/07/2017 7:17 PM    [facility] [address] [place], [place], [place]  [address] Phone: [phone]

## 2017-08-17 DEATH — deceased

## 2018-03-03 IMAGING — CR DG HAND COMPLETE 3+V*R*
3 series · 3 of 3 positions shown · non-contrast
Comparison: 08/01/2016

CLINICAL DATA: Right hand swelling with redness

EXAM:
RIGHT HAND - COMPLETE 3+ VIEW

[x hand pa right]
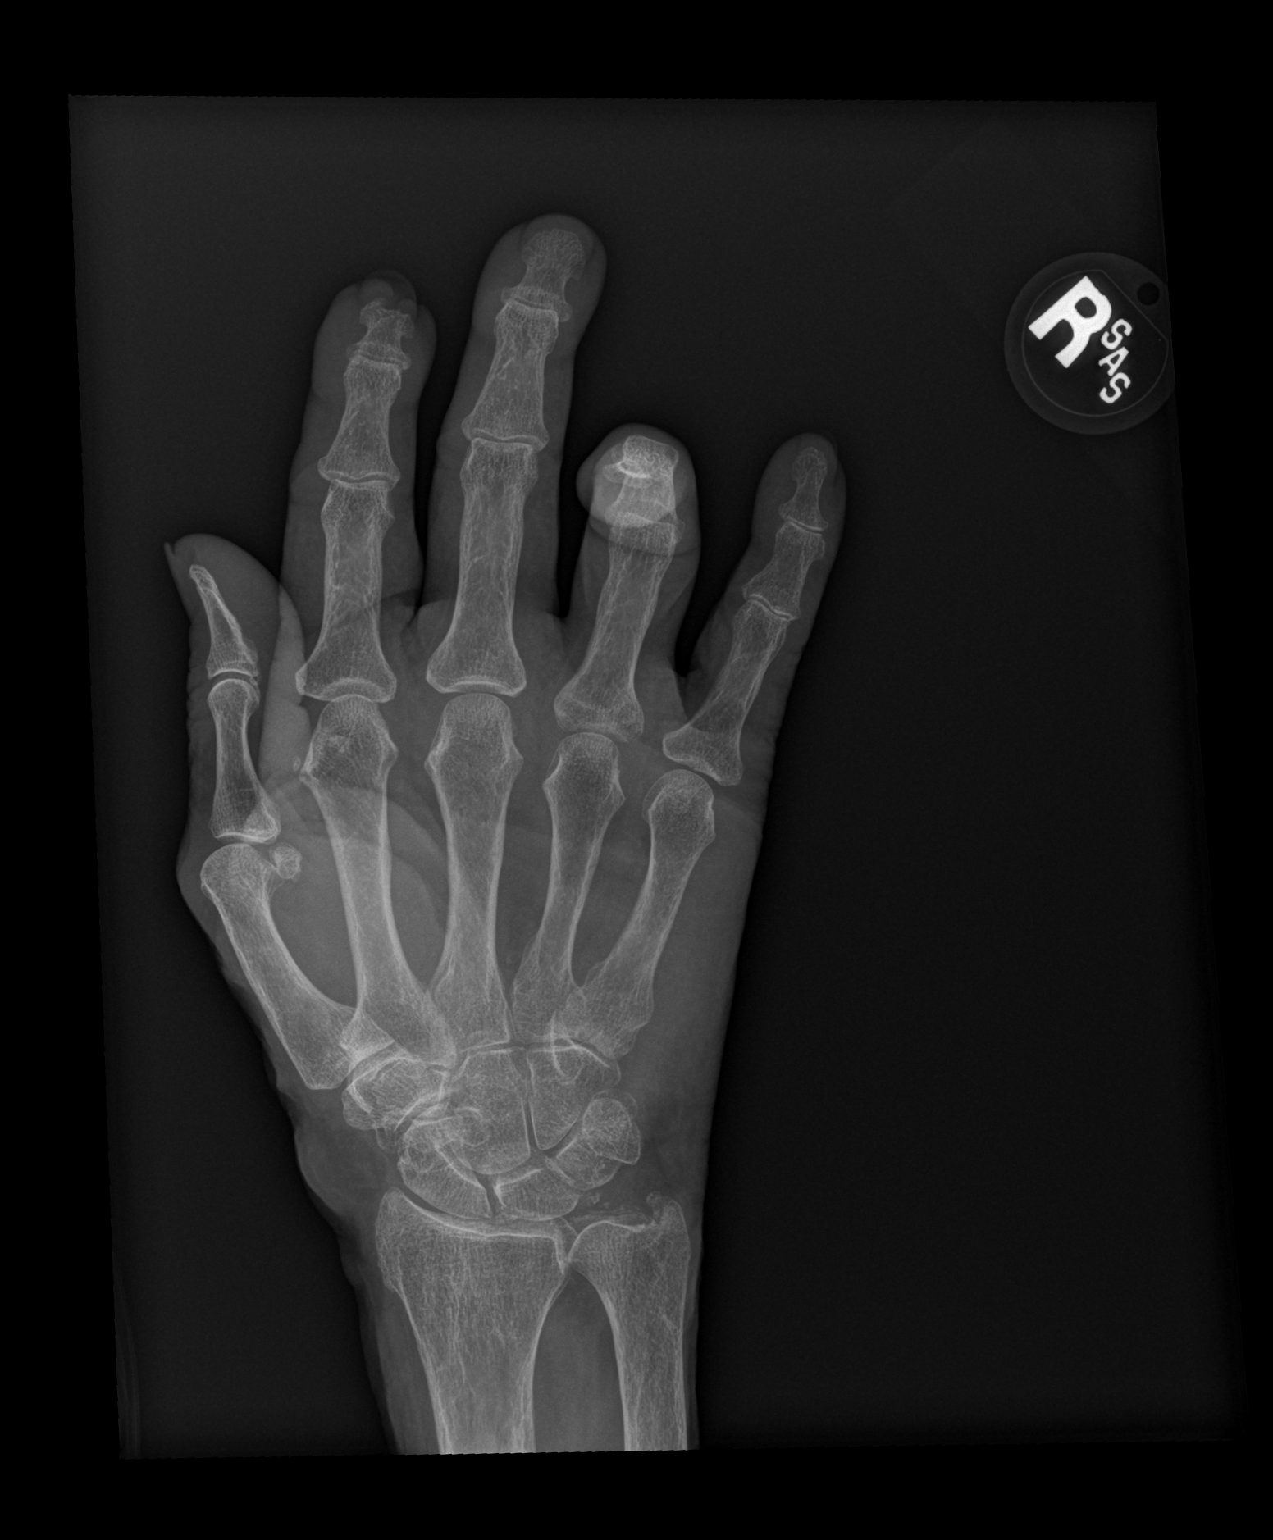

[x hand obl right]
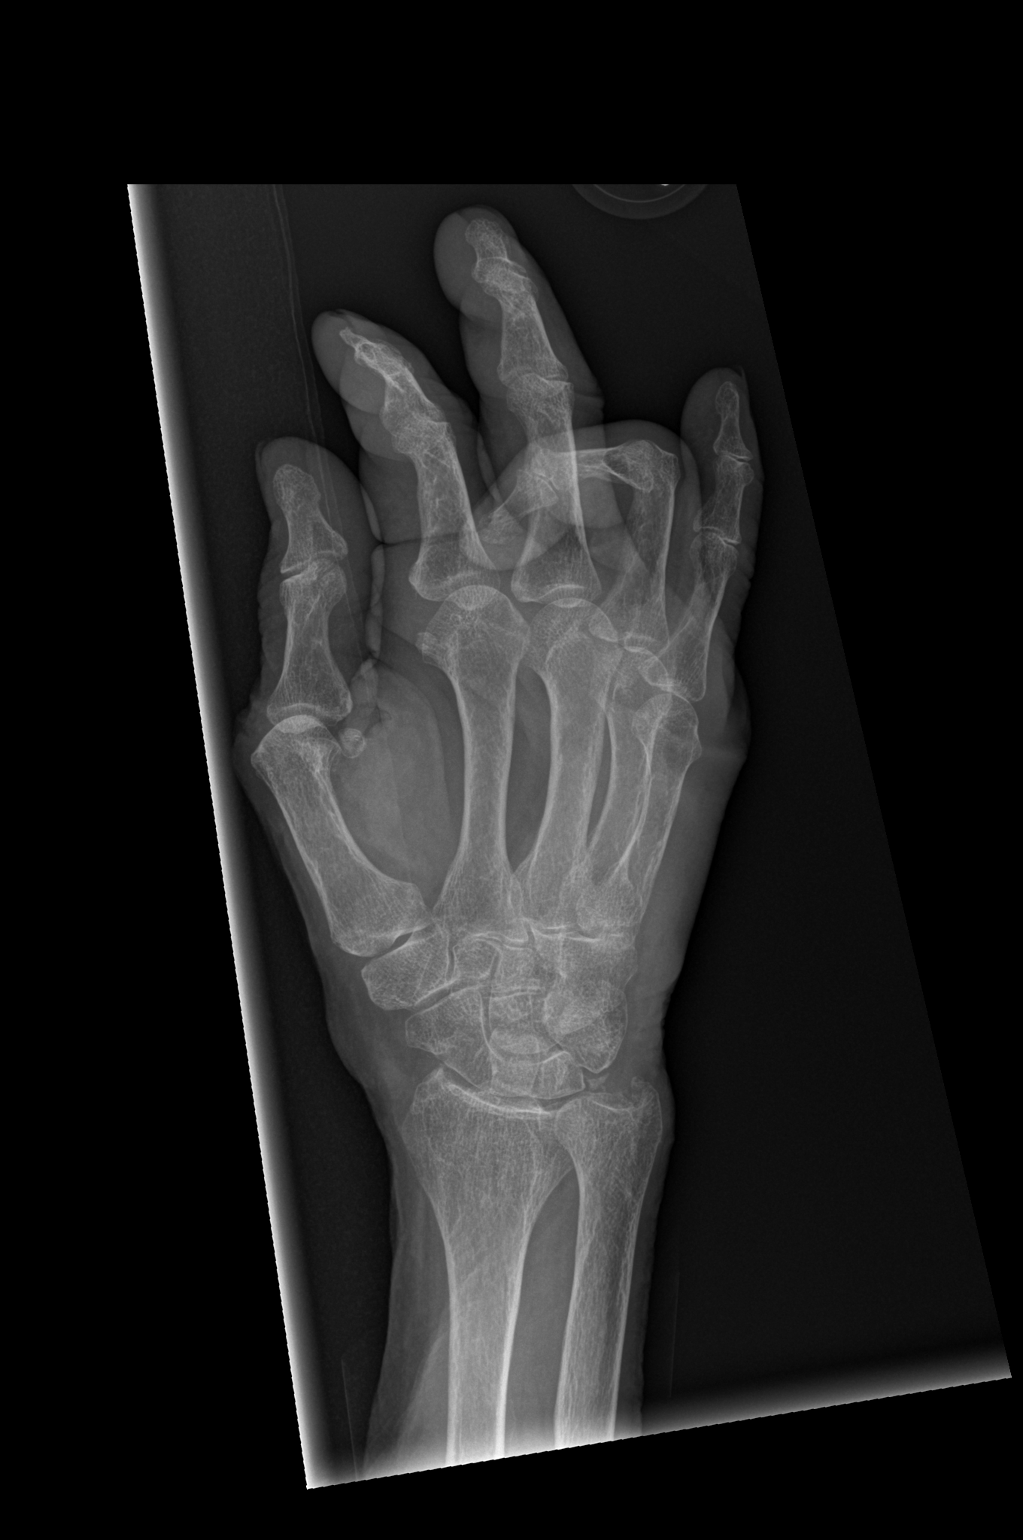

[x hand lat right]
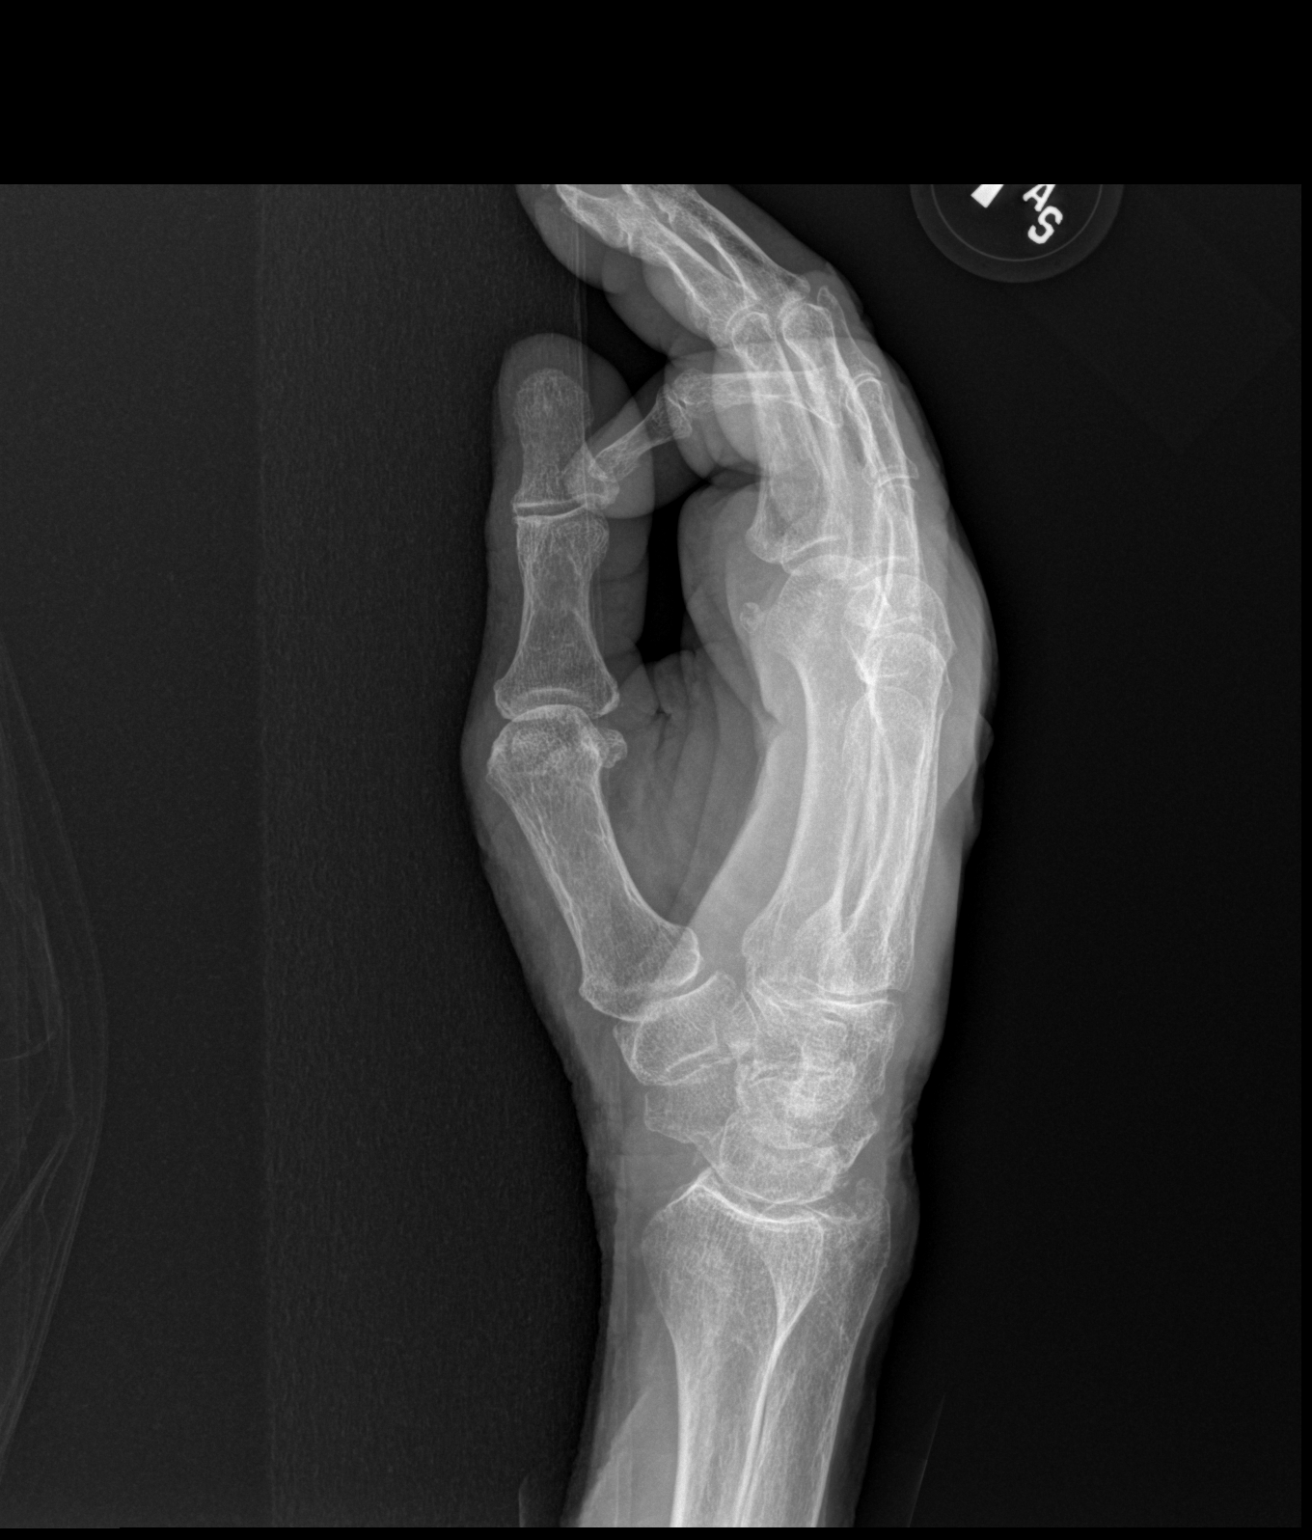

[3 of 3 positions shown; findings below may reference images not displayed]

FINDINGS: Osteopenia is present. There is no fracture. There is flexion
deformity at the fourth PIP joint. No osseous erosions. No
significant joint space narrowing. Calcifications at the triangular
fibrocartilage.
IMPRESSION: 1. Osteopenia without acute osseous abnormality
2. Chondrocalcinosis at the wrist

## 2018-03-03 IMAGING — CR DG PELVIS 1-2V
2 series · 2 of 2 positions shown · non-contrast
Comparison: None.

CLINICAL DATA: Status post fall 24 hours ago, with generalized
pelvic pain. Initial encounter.

EXAM:
PELVIS - 1-2 VIEW

[x pelvis (1 of 2)]
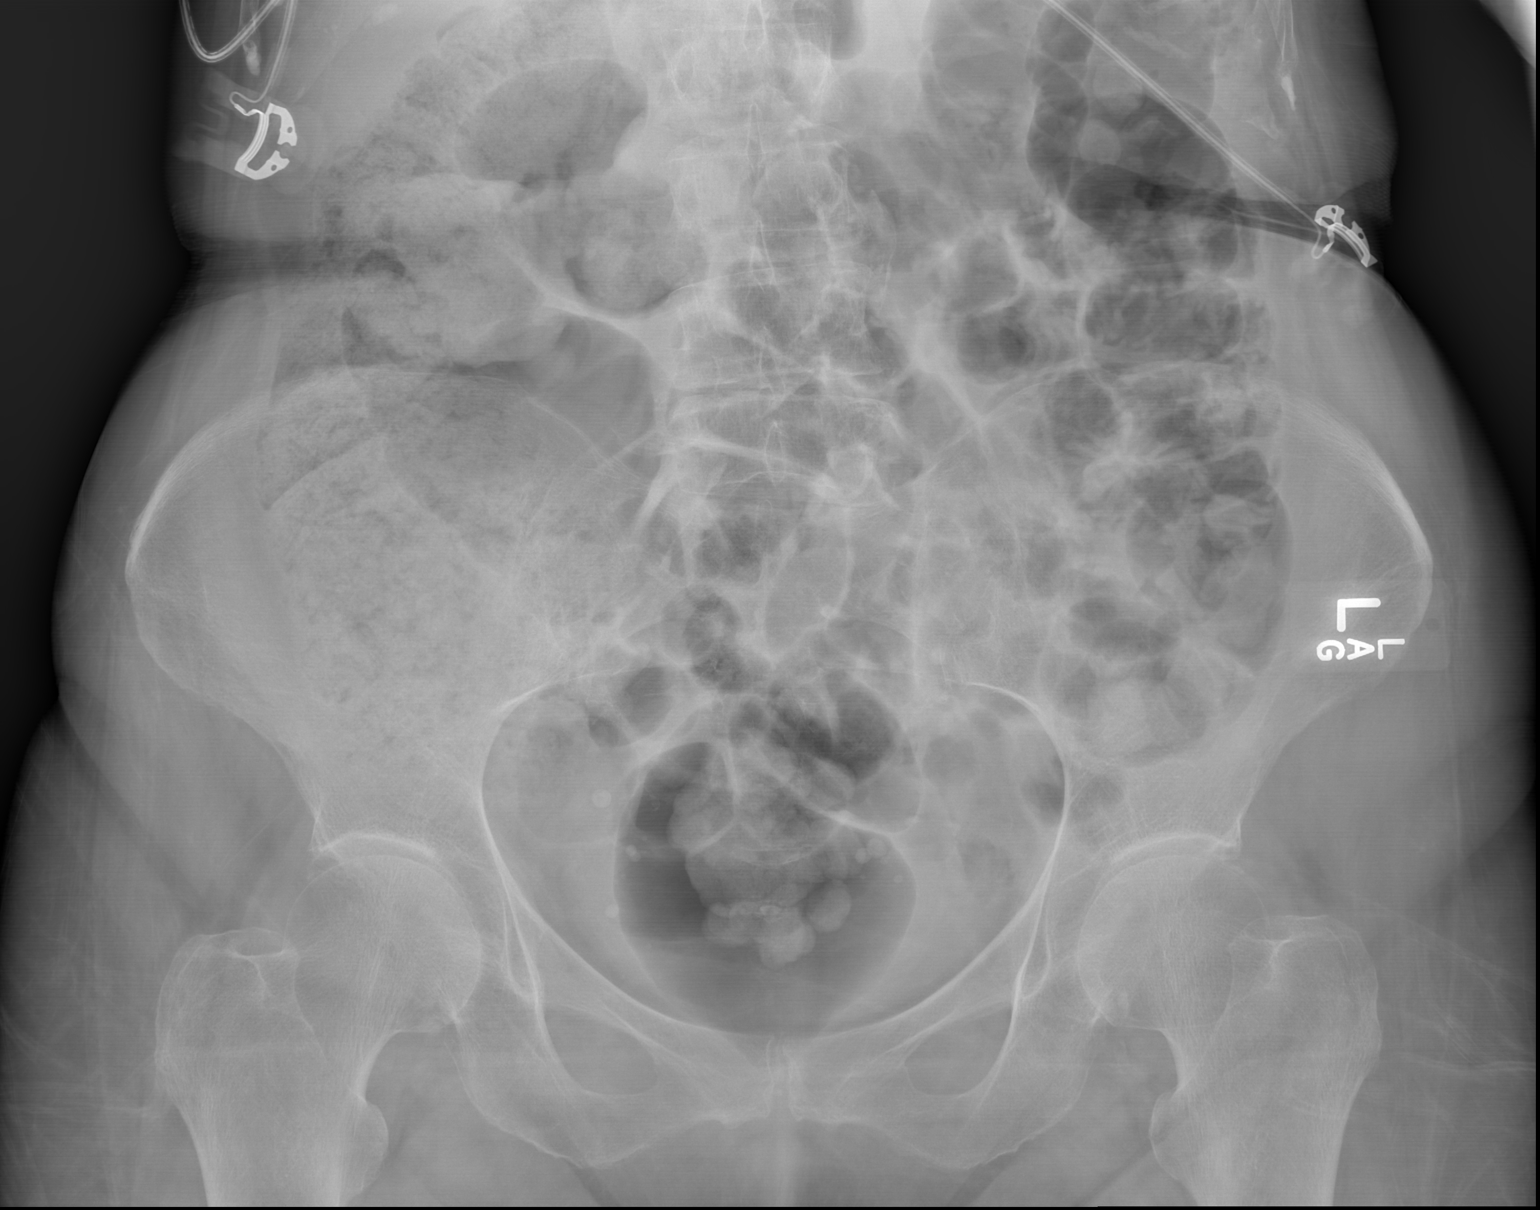

[x pelvis (2 of 2)]
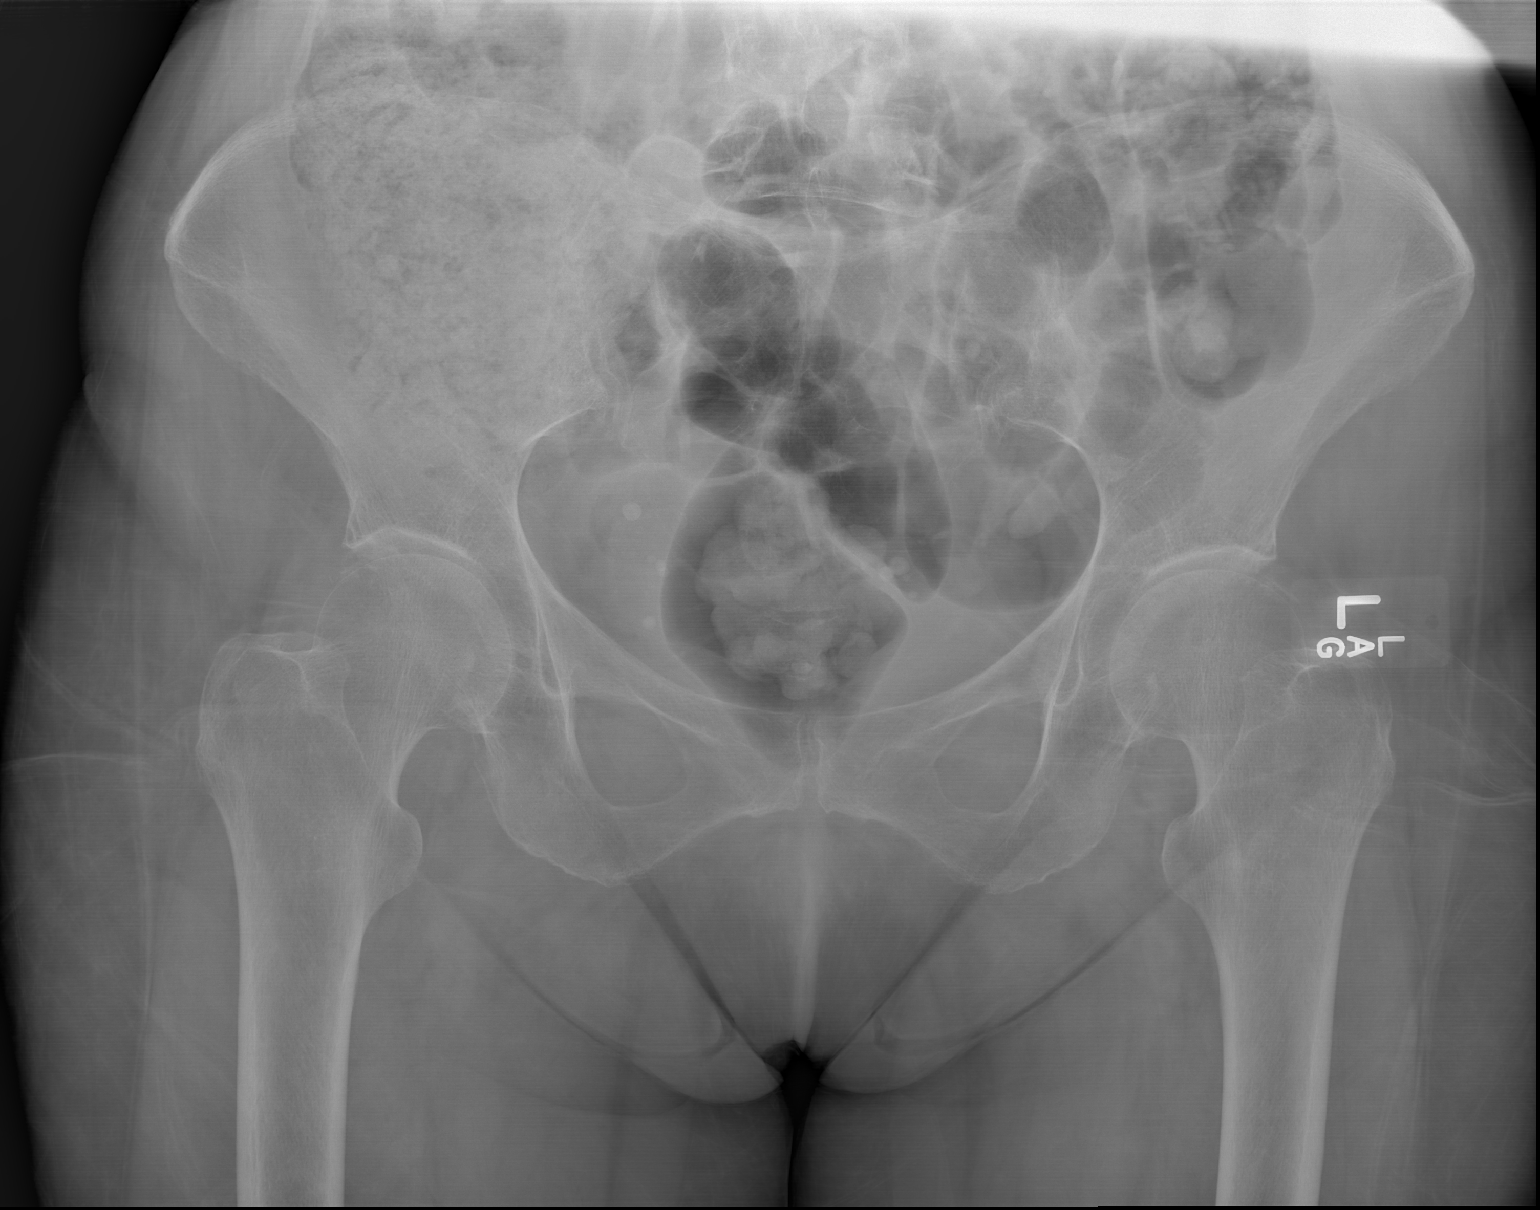

[2 of 2 positions shown; findings below may reference images not displayed]

FINDINGS: There is no evidence of fracture or dislocation. Both femoral heads
are seated normally within their respective acetabula. No
significant degenerative change is appreciated. The sacroiliac
joints are unremarkable in appearance.

The visualized bowel gas pattern is grossly unremarkable in
appearance. Scattered phleboliths are noted within the pelvis.
IMPRESSION: No evidence of fracture or dislocation.
